# Patient Record
Sex: Female | Born: 1975 | Race: White | Hispanic: No | Marital: Married | Smoking: Current every day smoker
Health system: Southern US, Community
[De-identification: ages and names within clinical notes are randomized; demographics above are authoritative.]

## PROBLEM LIST (undated history)

## (undated) DIAGNOSIS — E079 Disorder of thyroid, unspecified: Secondary | ICD-10-CM

## (undated) DIAGNOSIS — J42 Unspecified chronic bronchitis: Secondary | ICD-10-CM

## (undated) HISTORY — PX: THYROIDECTOMY: SHX17

## (undated) HISTORY — PX: ABDOMINAL HYSTERECTOMY: SHX81

---

## 2012-12-12 ENCOUNTER — Emergency Department: Payer: Self-pay | Admitting: Emergency Medicine

## 2016-08-17 ENCOUNTER — Ambulatory Visit (INDEPENDENT_AMBULATORY_CARE_PROVIDER_SITE_OTHER): Payer: 59

## 2016-08-17 ENCOUNTER — Ambulatory Visit
Admission: EM | Admit: 2016-08-17 | Discharge: 2016-08-17 | Disposition: A | Discharge status: Home / Self Care | Payer: 59 | Attending: Family Medicine | Admitting: Family Medicine

## 2016-08-17 DIAGNOSIS — R079 Chest pain, unspecified: Secondary | ICD-10-CM

## 2016-08-17 DIAGNOSIS — J209 Acute bronchitis, unspecified: Secondary | ICD-10-CM

## 2016-08-17 DIAGNOSIS — R05 Cough: Secondary | ICD-10-CM | POA: Diagnosis present

## 2016-08-17 DIAGNOSIS — R109 Unspecified abdominal pain: Secondary | ICD-10-CM | POA: Diagnosis present

## 2016-08-17 MED ORDER — AZITHROMYCIN 500 MG PO TABS: ORAL_TABLET | ORAL | 0 refills | Status: DC

## 2016-08-17 MED ORDER — FEXOFENADINE-PSEUDOEPHED ER 180-240 MG PO TB24: 1.0000 | ORAL_TABLET | Freq: Every day | ORAL | 0 refills | Status: DC

## 2016-08-17 MED ORDER — HYDROCOD POLST-CPM POLST ER 10-8 MG/5ML PO SUER: 5.0000 mL | Freq: Two times a day (BID) | ORAL | 0 refills | Status: DC | PRN

## 2016-08-17 MED ORDER — ALBUTEROL SULFATE HFA 108 (90 BASE) MCG/ACT IN AERS: 2.0000 | INHALATION_SPRAY | RESPIRATORY_TRACT | 0 refills | Status: DC | PRN

## 2016-08-17 NOTE — ED Triage Notes (Signed)
Patient reports that she has been having a cough for the last 3 weeks today. Patient now reports right upper quadrant pain that started around 3pm today that radiates around flank to back and described as stabbing. Patient reports that she has been having an increase in heartburn recently.

## 2016-08-17 NOTE — ED Provider Notes (Signed)
[facility]    CSN: [id number] Arrival date & time: 08/17/16  1653     History   Chief Complaint Chief Complaint  Patient presents with  . Cough  . Abdominal Pain    RUQ    HPI Courtney [name] is a 40 y.o. female.   Patient's here because of cough for over 3 weeks. While the cough for 3 weeks she's had nasal congestion as well. She's also had some bronchospasms. She reports today she started having pain in the right lower chest upper abdomen she felt stabbing pain that went all the way to the back. Cough is nonproductive she does smoke she has no known drug allergies. Surgery she's had abdominal hysterectomy. She has multiple cancers in the family and diabetes and heart disease present in the family. She reports being under a lot of stress lately as well.   The history is provided by the patient.  Cough  Cough characteristics:  Productive, croupy, nocturnal and non-productive Sputum characteristics:  White and frothy Severity:  Moderate Onset quality:  Sudden Timing:  Constant Chronicity:  New Smoker: no   Relieved by:  Nothing Worsened by:  Activity and deep breathing Associated symptoms: chest pain, headaches and shortness of breath   Abdominal Pain  Associated symptoms: chest pain, cough and shortness of breath     History reviewed. No pertinent past medical history.  There are no active problems to display for this patient.   Past Surgical History:  Procedure Laterality Date  . ABDOMINAL HYSTERECTOMY      OB History    No data available       Home Medications    Prior to Admission medications   Medication Sig Start Date End Date Taking? Authorizing Provider  albuterol (PROVENTIL HFA;VENTOLIN HFA) 108 (90 Base) MCG/ACT inhaler Inhale 2 puffs into the lungs every 4 (four) hours as needed for wheezing or shortness of breath. 08/17/16   Hassan RowanEugene Winda Summerall, MD  azithromycin (ZITHROMAX) 500 MG tablet 1 tablet daily for 5 days 08/17/16   Hassan RowanEugene Aleesia Henney, MD   chlorpheniramine-HYDROcodone [facility](TUSSIONEX PENNKINETIC ER) 10-8 MG/5ML SUER Take 5 mLs by mouth every 12 (twelve) hours as needed for cough. 08/17/16   Hassan RowanEugene Tamotsu Wiederholt, MD  fexofenadine-pseudoephedrine (ALLEGRA-D ALLERGY & CONGESTION) 180-240 MG 24 hr tablet Take 1 tablet by mouth daily. 08/17/16   Hassan RowanEugene Jaun Galluzzo, MD    Family History History reviewed. No pertinent family history.  Social History Social History  Substance Use Topics  . Smoking status: Current Every Day Smoker    Packs/day: 1.00  . Smokeless tobacco: Never Used  . Alcohol use Yes     Comment: occasionally     Allergies   Patient has no known allergies.   Review of Systems Review of Systems  Respiratory: Positive for cough and shortness of breath.   Cardiovascular: Positive for chest pain.  Gastrointestinal: Positive for abdominal pain.  Neurological: Positive for headaches.  All other systems reviewed and are negative.    Physical Exam Triage Vital Signs ED Triage Vitals  Enc Vitals Group     BP 08/17/16 1843 112/63     Pulse Rate 08/17/16 1843 87     Resp 08/17/16 1843 17     Temp 08/17/16 1843 99.3 F (37.4 C)     Temp Source 08/17/16 1843 Oral     SpO2 08/17/16 1843 99 %     Weight 08/17/16 1841 155 lb (70.3 kg)     Height 08/17/16 1841 5\' 9"  (1.753  m)     Head Circumference --      Peak Flow --      Pain Score 08/17/16 1844 7     Pain Loc --      Pain Edu? --      Excl. in GC? --    No data found.   Updated Vital Signs BP 112/63 (BP Location: Left Arm)   Pulse 87   Temp 99.3 F (37.4 C) (Oral)   Resp 17   Ht 5\' 9"  (1.753 m)   Wt 155 lb (70.3 kg)   SpO2 99%   BMI 22.89 kg/m   Visual Acuity Right Eye Distance:   Left Eye Distance:   Bilateral Distance:    Right Eye Near:   Left Eye Near:    Bilateral Near:     Physical Exam  Constitutional: She is oriented to person, place, and time. She appears well-developed and well-nourished.  HENT:  Head: Normocephalic and atraumatic.    Right Ear: External ear normal.  Left Ear: External ear normal.  Nose: Nose normal.  Mouth/Throat: Oropharynx is clear and moist.  Eyes: Pupils are equal, round, and reactive to light.  Neck: Normal range of motion. Neck supple.  Cardiovascular: Normal rate, regular rhythm and normal heart sounds.   Pulmonary/Chest: Effort normal and breath sounds normal.  Abdominal: Soft. Bowel sounds are normal. She exhibits no distension.  Musculoskeletal: Normal range of motion.  Neurological: She is alert and oriented to person, place, and time.  Skin: Skin is warm. No rash noted. No erythema.  Psychiatric: She has a normal mood and affect.  Vitals reviewed.    UC Treatments / Results  Labs (all labs ordered are listed, but only abnormal results are displayed) Labs Reviewed - No data to display  EKG  EKG Interpretation None      ED ECG REPORT I, [place] Lebeau H, the attending physician, personally viewed and interpreted this ECG.   Date: 08/17/2016  EKG Time:19:23:48  Rate: 82  Rhythm: normal EKG, normal sinus rhythm, there are no previous tracings available for comparison  Axis: 21  Intervals:none  ST&T Change: none   Radiology Dg Chest 2 View  Result Date: 08/17/2016 CLINICAL DATA:  Cough for 3 weeks EXAM: CHEST  2 VIEW COMPARISON:  None. FINDINGS: The heart size and mediastinal contours are within normal limits. Both lungs are clear. The visualized skeletal structures are unremarkable. IMPRESSION: No active cardiopulmonary disease. Electronically Signed   By: Natasha MeadLiviu  Pop M.D.   On: 08/17/2016 20:03    Procedures Procedures (including critical care time)  Medications Ordered in UC Medications - No data to display   Initial Impression / Assessment and Plan / UC Course  I have reviewed the triage vital signs and the nursing notes.  Pertinent labs & imaging results that were available during my care of the patient were reviewed by me and considered in my medical decision  making (see chart for details).  Clinical Course    The chest x-ray being negative EKG being negative will treat for bronchitis. Placed on albuterol inhaler, Tussionex for cough 1 teaspoon twice a day Zithromax 500 mg 1 tablet daily for 5 days and Allegra-D. We'll have a follow-up PCP of her choice to give a work note for today and tomorrow as well.     Final Clinical Impressions(s) / UC Diagnoses   Final diagnoses:  Acute bronchitis with bronchospasm  Right-sided chest pain    New Prescriptions Discharge Medication List as of  08/17/2016  7:59 PM    START taking these medications   Details  albuterol (PROVENTIL HFA;VENTOLIN HFA) 108 (90 Base) MCG/ACT inhaler Inhale 2 puffs into the lungs every 4 (four) hours as needed for wheezing or shortness of breath., Starting Tue 08/17/2016, Normal    azithromycin (ZITHROMAX) 500 MG tablet 1 tablet daily for 5 days, Normal    chlorpheniramine-HYDROcodone (TUSSIONEX PENNKINETIC ER) 10-8 MG/5ML SUER Take 5 mLs by mouth every 12 (twelve) hours as needed for cough., Starting Tue 08/17/2016, Normal    fexofenadine-pseudoephedrine (ALLEGRA-D ALLERGY & CONGESTION) 180-240 MG 24 hr tablet Take 1 tablet by mouth daily., Starting Tue 08/17/2016, Normal         Hassan Rowan, MD 08/17/16 2049

## 2017-05-16 ENCOUNTER — Ambulatory Visit
Admission: EM | Admit: 2017-05-16 | Discharge: 2017-05-16 | Disposition: A | Discharge status: Home / Self Care | Payer: BLUE CROSS/BLUE SHIELD | Attending: Family Medicine | Admitting: Family Medicine

## 2017-05-16 ENCOUNTER — Encounter: Payer: Self-pay | Admitting: Emergency Medicine

## 2017-05-16 DIAGNOSIS — M549 Dorsalgia, unspecified: Secondary | ICD-10-CM | POA: Diagnosis not present

## 2017-05-16 DIAGNOSIS — R110 Nausea: Secondary | ICD-10-CM

## 2017-05-16 DIAGNOSIS — R519 Headache, unspecified: Secondary | ICD-10-CM

## 2017-05-16 DIAGNOSIS — R51 Headache: Secondary | ICD-10-CM

## 2017-05-16 MED ORDER — KETOROLAC TROMETHAMINE 60 MG/2ML IM SOLN
60.0000 mg | Freq: Once | INTRAMUSCULAR | Status: AC
  Administered 2017-05-16: 60 mg via INTRAMUSCULAR

## 2017-05-16 MED ORDER — HYDROCODONE-ACETAMINOPHEN 5-325 MG PO TABS: ORAL_TABLET | ORAL | 0 refills | Status: DC

## 2017-05-16 NOTE — ED Triage Notes (Signed)
Patient states she has had a headache off and on for about 2 weeks.  Per patient the back of her head feels numb and it runs down her back and into her legs

## 2017-05-16 NOTE — Discharge Instructions (Signed)
Establish care with Primary Care Provider for follow up °

## 2017-05-16 NOTE — ED Provider Notes (Signed)
[facility]    CSN: [id number] Arrival date & time: 05/16/17  1521     History   Chief Complaint Chief Complaint  Patient presents with  . Headache    HPI Courtney [name] is a 40 y.o. female.   The history is provided by the patient.  Headache  Pain location:  R parietal Radiates to:  R neck Severity currently:  7/10 Duration:  2 weeks Timing:  Constant Chronicity:  New Similar to prior headaches: no   Context comment:  Unknown trigger Relieved by:  Nothing Ineffective treatments:  Acetaminophen and NSAIDs Associated symptoms: back pain (chronic), nausea and photophobia   Associated symptoms: no abdominal pain, no blurred vision, no congestion, no cough, no diarrhea, no dizziness, no drainage, no ear pain, no eye pain, no facial pain, no fatigue, no fever, no focal weakness, no hearing loss, no loss of balance, no myalgias, no near-syncope, no neck pain, no neck stiffness, no numbness, no paresthesias, no seizures, no sinus pressure, no sore throat, no swollen glands, no syncope, no tingling, no URI, no visual change, no vomiting and no weakness     History reviewed. No pertinent past medical history.  There are no active problems to display for this patient.   Past Surgical History:  Procedure Laterality Date  . ABDOMINAL HYSTERECTOMY      OB History    No data available       Home Medications    Prior to Admission medications   Medication Sig Start Date End Date Taking? Authorizing Provider  albuterol (PROVENTIL HFA;VENTOLIN HFA) 108 (90 Base) MCG/ACT inhaler Inhale 2 puffs into the lungs every 4 (four) hours as needed for wheezing or shortness of breath. 08/17/16   Hassan Rowan, MD  azithromycin (ZITHROMAX) 500 MG tablet 1 tablet daily for 5 days 08/17/16   Hassan Rowan, MD  chlorpheniramine-HYDROcodone [facility] ER) 10-8 MG/5ML SUER Take 5 mLs by mouth every 12 (twelve) hours as needed for cough. 08/17/16   Hassan Rowan, MD    fexofenadine-pseudoephedrine (ALLEGRA-D ALLERGY & CONGESTION) 180-240 MG 24 hr tablet Take 1 tablet by mouth daily. 08/17/16   Hassan Rowan, MD  HYDROcodone-acetaminophen (NORCO/VICODIN) 5-325 MG tablet 1-2 tabs po qd prn 05/16/17   Payton Mccallum, MD    Family History Family History  Problem Relation Age of Onset  . Diabetes Mother   . Cancer Other   . Pulmonary embolism Sister   . Deep vein thrombosis Sister     Social History Social History  Substance Use Topics  . Smoking status: Current Every Day Smoker    Packs/day: 1.00  . Smokeless tobacco: Never Used  . Alcohol use Yes     Comment: occasionally     Allergies   Patient has no known allergies.   Review of Systems Review of Systems  Constitutional: Negative for fatigue and fever.  HENT: Negative for congestion, ear pain, hearing loss, postnasal drip, sinus pressure and sore throat.   Eyes: Positive for photophobia. Negative for blurred vision and pain.  Respiratory: Negative for cough.   Cardiovascular: Negative for syncope and near-syncope.  Gastrointestinal: Positive for nausea. Negative for abdominal pain, diarrhea and vomiting.  Musculoskeletal: Positive for back pain (chronic). Negative for myalgias, neck pain and neck stiffness.  Neurological: Positive for headaches. Negative for dizziness, focal weakness, seizures, weakness, numbness, paresthesias and loss of balance.     Physical Exam Triage Vital Signs ED Triage Vitals  Enc Vitals Group     BP 05/16/17  1538 120/71     Pulse Rate 05/16/17 1538 91     Resp 05/16/17 1538 18     Temp 05/16/17 1538 98.8 F (37.1 C)     Temp src --      SpO2 05/16/17 1538 98 %     Weight 05/16/17 1535 172 lb 2.9 oz (78.1 kg)     Height 05/16/17 1535 5\' 9"  (1.753 m)     Head Circumference --      Peak Flow --      Pain Score 05/16/17 1535 9     Pain Loc --      Pain Edu? --      Excl. in GC? --    No data found.   Updated Vital Signs BP 120/71 (BP Location:  Left Arm)   Pulse 91   Temp 98.8 F (37.1 C)   Resp 18   Ht 5\' 9"  (1.753 m)   Wt 172 lb 2.9 oz (78.1 kg)   SpO2 98%   BMI 25.43 kg/m   Visual Acuity Right Eye Distance:   Left Eye Distance:   Bilateral Distance:    Right Eye Near:   Left Eye Near:    Bilateral Near:     Physical Exam  Constitutional: She is oriented to person, place, and time. She appears well-developed and well-nourished. No distress.  HENT:  Head: Normocephalic.  Right Ear: Tympanic membrane, external ear and ear canal normal.  Left Ear: Tympanic membrane, external ear and ear canal normal.  Nose: Nose normal.  Mouth/Throat: Oropharynx is clear and moist and mucous membranes are normal.  Eyes: Pupils are equal, round, and reactive to light. Conjunctivae and EOM are normal. Right eye exhibits no discharge. Left eye exhibits no discharge. No scleral icterus.  Neck: Normal range of motion. Neck supple. No JVD present. No tracheal deviation present. No thyromegaly present.  Cardiovascular: Normal rate.   Pulmonary/Chest: Effort normal. No stridor. No respiratory distress.  Musculoskeletal: She exhibits no edema or tenderness.  Lymphadenopathy:    She has no cervical adenopathy.  Neurological: She is alert and oriented to person, place, and time. She has normal reflexes. She displays normal reflexes. No cranial nerve deficit or sensory deficit. She exhibits normal muscle tone. Coordination normal.  Skin: No rash noted. She is not diaphoretic.  Nursing note and vitals reviewed.    UC Treatments / Results  Labs (all labs ordered are listed, but only abnormal results are displayed) Labs Reviewed - No data to display  EKG  EKG Interpretation None       Radiology No results found.  Procedures Procedures (including critical care time)  Medications Ordered in UC Medications  ketorolac (TORADOL) injection 60 mg (60 mg Intramuscular Given 05/16/17 1623)     Initial Impression / Assessment and Plan  / UC Course  I have reviewed the triage vital signs and the nursing notes.  Pertinent labs & imaging results that were available during my care of the patient were reviewed by me and considered in my medical decision making (see chart for details).       Final Clinical Impressions(s) / UC Diagnoses   Final diagnoses:  Nonintractable episodic headache, unspecified headache type    New Prescriptions Discharge Medication List as of 05/16/2017  4:54 PM    START taking these medications   Details  HYDROcodone-acetaminophen (NORCO/VICODIN) 5-325 MG tablet 1-2 tabs po qd prn, Print       1. diagnosis reviewed with patient 2. rx as  per orders above; reviewed possible side effects, interactions, risks and benefits  3. Recommend supportive treatment with rest, otc analgesics prn 4. Patient given toradol 60mg  IM x1 with improvement of symptoms 5. Follow-up prn if symptoms worsen or don't improve  Controlled Substance Prescriptions [place] Controlled Substance Registry consulted? Yes, I have consulted the [place] Controlled Substances Registry for this patient, and feel the risk/benefit ratio today is favorable for proceeding with this prescription for a controlled substance.   Payton Mccallum, MD 05/16/17 [phone]

## 2017-05-16 NOTE — ED Notes (Signed)
Patient shows no signs of adverse reaction to medication at this time.  

## 2018-06-02 ENCOUNTER — Other Ambulatory Visit: Payer: Self-pay

## 2018-06-02 ENCOUNTER — Ambulatory Visit
Admission: EM | Admit: 2018-06-02 | Discharge: 2018-06-02 | Disposition: A | Discharge status: Home / Self Care | Payer: BLUE CROSS/BLUE SHIELD | Attending: Emergency Medicine | Admitting: Emergency Medicine

## 2018-06-02 DIAGNOSIS — R300 Dysuria: Secondary | ICD-10-CM | POA: Diagnosis not present

## 2018-06-02 DIAGNOSIS — Z90710 Acquired absence of both cervix and uterus: Secondary | ICD-10-CM | POA: Diagnosis not present

## 2018-06-02 DIAGNOSIS — Z113 Encounter for screening for infections with a predominantly sexual mode of transmission: Secondary | ICD-10-CM

## 2018-06-02 DIAGNOSIS — I863 Vulval varices: Secondary | ICD-10-CM | POA: Diagnosis not present

## 2018-06-02 DIAGNOSIS — N938 Other specified abnormal uterine and vaginal bleeding: Secondary | ICD-10-CM

## 2018-06-02 DIAGNOSIS — N939 Abnormal uterine and vaginal bleeding, unspecified: Secondary | ICD-10-CM

## 2018-06-02 LAB — URINALYSIS, COMPLETE (UACMP) WITH MICROSCOPIC
Bacteria, UA: NONE SEEN
Bilirubin Urine: NEGATIVE
Glucose, UA: 250 mg/dL — AB
Ketones, ur: NEGATIVE mg/dL
Leukocytes, UA: NEGATIVE
Nitrite: NEGATIVE
Protein, ur: NEGATIVE mg/dL
Specific Gravity, Urine: 1.02 (ref 1.005–1.030)
pH: 6.5 (ref 5.0–8.0)

## 2018-06-02 LAB — WET PREP, GENITAL
Clue Cells Wet Prep HPF POC: NONE SEEN
Sperm: NONE SEEN
Trich, Wet Prep: NONE SEEN
Yeast Wet Prep HPF POC: NONE SEEN

## 2018-06-02 LAB — CHLAMYDIA/NGC RT PCR (ARMC ONLY)
Chlamydia Tr: NOT DETECTED
N gonorrhoeae: NOT DETECTED

## 2018-06-02 NOTE — Discharge Instructions (Signed)
Your exam today revealed a vulvar varicosity. This is likely due to your use of kegel exercisers. This increased strain can cause veins to swell and form a varicosity which can bleed. Apply OTC Preparation H as directed. Take warm sitz baths and schedule and appointment with gynecology as soon as possible. Go to  ER for any uncontrolled bleeding. Routine culture sent for bacterial vaginal infections. We will call with results.

## 2018-06-02 NOTE — ED Triage Notes (Signed)
Patient complains of vaginal bleeding that started this morning. Patient states that she had a hysterectomy 6-7 years ago. Patient reports that she still has her ovaries. Patient states that she has recently been working out more and was unsure if that was causing it.

## 2018-06-02 NOTE — ED Provider Notes (Signed)
[facility]    CSN: [id number] Arrival date & time: 06/02/18  1316     History   Chief Complaint Chief Complaint  Patient presents with  . Vaginal Bleeding    HPI Courtney [name] is a 42 y.o. female. Patient states she began having "a lot" of vaginal bleeding this morning. Patient states she has had a hysterectomy 6-7 years ago. Admitted to heavy bleeding and "lesions on cervix" before hysterectomy. Denies history of gyne cancers. Patient denies vaginal discharge or pelvic pain. Admits to painful urination intermittently since yesterday. She does admit to inserting kegel balls and doing kegel exercises daily. She has no other complaints today.  HPI  History reviewed. No pertinent past medical history.  There are no active problems to display for this patient.   Past Surgical History:  Procedure Laterality Date  . ABDOMINAL HYSTERECTOMY      OB History   None      Home Medications    Prior to Admission medications   Medication Sig Start Date End Date Taking? Authorizing Provider  levothyroxine (SYNTHROID, LEVOTHROID) 112 MCG tablet  04/23/18  Yes [provider]  albuterol (PROVENTIL HFA;VENTOLIN HFA) 108 (90 Base) MCG/ACT inhaler Inhale 2 puffs into the lungs every 4 (four) hours as needed for wheezing or shortness of breath. 08/17/16   Hassan Rowan, MD  azithromycin (ZITHROMAX) 500 MG tablet 1 tablet daily for 5 days 08/17/16   Hassan Rowan, MD  chlorpheniramine-HYDROcodone [facility] ER) 10-8 MG/5ML SUER Take 5 mLs by mouth every 12 (twelve) hours as needed for cough. 08/17/16   Hassan Rowan, MD  fexofenadine-pseudoephedrine (ALLEGRA-D ALLERGY & CONGESTION) 180-240 MG 24 hr tablet Take 1 tablet by mouth daily. 08/17/16   Hassan Rowan, MD  HYDROcodone-acetaminophen (NORCO/VICODIN) 5-325 MG tablet 1-2 tabs po qd prn 05/16/17   Payton Mccallum, MD    Family History Family History  Problem Relation Age of Onset  . Diabetes Mother   .  Cancer Other   . Pulmonary embolism Sister   . Deep vein thrombosis Sister     Social History Social History   Tobacco Use  . Smoking status: Current Every Day Smoker    Packs/day: 2.00  . Smokeless tobacco: Never Used  Substance Use Topics  . Alcohol use: Yes    Comment: occasionally  . Drug use: No     Allergies   Patient has no known allergies.   Review of Systems Review of Systems  Constitutional: Negative for appetite change and fever.  Gastrointestinal: Negative for abdominal distention, abdominal pain, diarrhea, nausea and vomiting.  Genitourinary: Positive for dysuria and vaginal bleeding. Negative for dyspareunia, frequency, genital sores, hematuria, vaginal discharge and vaginal pain.  Skin: Negative for rash.  Neurological: Negative for dizziness.     Physical Exam Triage Vital Signs ED Triage Vitals  Enc Vitals Group     BP 06/02/18 1343 130/64     Pulse Rate 06/02/18 1343 (!) 104     Resp 06/02/18 1343 18     Temp 06/02/18 1343 99.1 F (37.3 C)     Temp Source 06/02/18 1343 Oral     SpO2 06/02/18 1343 99 %     Weight 06/02/18 1341 165 lb (74.8 kg)     Height 06/02/18 1341 5\' 8"  (1.727 m)     Head Circumference --      Peak Flow --      Pain Score 06/02/18 1340 6     Pain Loc --  Pain Edu? --      Excl. in GC? --    No data found.  Updated Vital Signs BP 130/64 (BP Location: Left Arm)   Pulse (!) 104   Temp 99.1 F (37.3 C) (Oral)   Resp 18   Ht 5\' 8"  (1.727 m)   Wt 165 lb (74.8 kg)   SpO2 99%   BMI 25.09 kg/m       Physical Exam  Constitutional: She is oriented to person, place, and time. She appears well-developed and well-nourished. No distress.  HENT:  Head: Normocephalic and atraumatic.  Eyes: Conjunctivae and EOM are normal. No scleral icterus.  Cardiovascular: Normal rate, regular rhythm and normal heart sounds.  No murmur heard. Pulmonary/Chest: Effort normal and breath sounds normal. She has no wheezes.  Abdominal:  Soft. There is no tenderness. There is no guarding.  Genitourinary: Pelvic exam was performed with patient supine.  Genitourinary Comments: Labia normal without lesions. Enlarged varicosity of anterior vaginal wall noted which bleeds mildly upon palpation. No tenderness on palpation of varicosity. Small amount of bright red blood in vaginal vault. No cervix or uterus (pt with hysterectomy), no vaginal discharge. No obvious pain upon examination.   Lymphadenopathy: No inguinal adenopathy noted on the right or left side.  Neurological: She is alert and oriented to person, place, and time.  Skin: Skin is warm and dry. She is not diaphoretic. No erythema.  Psychiatric: She has a normal mood and affect. Her behavior is normal.  Nursing note and vitals reviewed.    UC Treatments / Results  Labs (all labs ordered are listed, but only abnormal results are displayed) Labs Reviewed  WET PREP, GENITAL - Abnormal; Notable for the following components:      Result Value   WBC, Wet Prep HPF POC RARE (*)    All other components within normal limits  URINALYSIS, COMPLETE (UACMP) WITH MICROSCOPIC - Abnormal; Notable for the following components:   Glucose, UA 250 (*)    Hgb urine dipstick SMALL (*)    All other components within normal limits  CHLAMYDIA/NGC RT PCR [facility] ONLY)    EKG None  Radiology No results found.  Procedures Procedures (including critical care time)  Medications Ordered in UC Medications - No data to display  Initial Impression / Assessment and Plan / UC Course  I have reviewed the triage vital signs and the nursing notes.  Pertinent labs & imaging results that were available during my care of the patient were reviewed by me and considered in my medical decision making (see chart for details).   Exam reveals vulvar varicosity. Likely due to kegel exercises. Stop exercises. Wear pads. Avoid intercourse. Advised preparation H, sitz baths, and f/u with  gynecology  GC/Chlam and wet prep done. Will call with results   Final Clinical Impressions(s) / UC Diagnoses   Final diagnoses:  Vulvar varicose veins  Vaginal bleeding     Discharge Instructions     Your exam today revealed a vulvar varicosity. This is likely due to your use of kegel exercisers. This increased strain can cause veins to swell and form a varicosity which can bleed. Apply OTC Preparation H as directed. Take warm sitz baths and schedule and appointment with gynecology as soon as possible. Go to  ER for any uncontrolled bleeding. Routine culture sent for bacterial vaginal infections. We will call with results.    ED Prescriptions    None     Controlled Substance Prescriptions [place] Controlled Substance Registry  consulted? Not Applicable   Gareth Morgan 06/03/18 [id number]

## 2018-12-18 ENCOUNTER — Other Ambulatory Visit: Payer: Self-pay | Admitting: Family Medicine

## 2018-12-18 ENCOUNTER — Encounter: Payer: Self-pay | Admitting: Emergency Medicine

## 2018-12-18 ENCOUNTER — Other Ambulatory Visit: Payer: Self-pay

## 2018-12-18 ENCOUNTER — Ambulatory Visit
Admission: EM | Admit: 2018-12-18 | Discharge: 2018-12-18 | Disposition: A | Discharge status: Home / Self Care | Payer: BLUE CROSS/BLUE SHIELD | Attending: Family Medicine | Admitting: Family Medicine

## 2018-12-18 ENCOUNTER — Ambulatory Visit
Admission: RE | Admit: 2018-12-18 | Discharge: 2018-12-18 | Disposition: A | Discharge status: Home / Self Care | Payer: BLUE CROSS/BLUE SHIELD | Source: Ambulatory Visit | Attending: Family Medicine | Admitting: Family Medicine

## 2018-12-18 ENCOUNTER — Ambulatory Visit
Admission: RE | Admit: 2018-12-18 | Discharge: 2018-12-18 | Disposition: A | Discharge status: Home / Self Care | Payer: BLUE CROSS/BLUE SHIELD | Attending: Family Medicine | Admitting: Family Medicine

## 2018-12-18 DIAGNOSIS — R079 Chest pain, unspecified: Secondary | ICD-10-CM

## 2018-12-18 DIAGNOSIS — F17210 Nicotine dependence, cigarettes, uncomplicated: Secondary | ICD-10-CM | POA: Diagnosis not present

## 2018-12-18 DIAGNOSIS — R05 Cough: Secondary | ICD-10-CM

## 2018-12-18 DIAGNOSIS — R059 Cough, unspecified: Secondary | ICD-10-CM

## 2018-12-18 DIAGNOSIS — J069 Acute upper respiratory infection, unspecified: Secondary | ICD-10-CM

## 2018-12-18 DIAGNOSIS — R0981 Nasal congestion: Secondary | ICD-10-CM | POA: Diagnosis not present

## 2018-12-18 DIAGNOSIS — J40 Bronchitis, not specified as acute or chronic: Secondary | ICD-10-CM

## 2018-12-18 DIAGNOSIS — J0100 Acute maxillary sinusitis, unspecified: Secondary | ICD-10-CM

## 2018-12-18 HISTORY — DX: Disorder of thyroid, unspecified: E07.9

## 2018-12-18 MED ORDER — AZITHROMYCIN 250 MG PO TABS: ORAL_TABLET | ORAL | 0 refills | Status: DC

## 2018-12-18 NOTE — Discharge Instructions (Signed)
-  Follow instructions and use prescriptions as provided by Dr. Zada Finders. -Instructions for sinus rinse as attached -If no improvement in 2 to 3 days or if your symptoms worsen, can start the antibiotic.  Azithromycin, take as prescribed on the package. -Plenty of fluids -Follow with primary care as needed for this clinic

## 2018-12-18 NOTE — ED Triage Notes (Signed)
Patient c/o productive cough and nasal congestion that started last Thursday.  Denies fever. Patient has taken OTC sinus vicks at night for her symptoms.

## 2018-12-18 NOTE — ED Provider Notes (Signed)
[facility]    CSN: [id number] Arrival date & time: 12/18/18  1644     History   Chief Complaint Chief Complaint  Patient presents with   Cough   Nasal Congestion    HPI Courtney [name] is a 43 y.o. female.   Patient is a 43 year old female with past medical history of thyroid disease who presents with a dry cough that started Thursday.  She states with a week and became more of a "chesty" cough with some chest wall tenderness.  She states the mucus she coughs up has a bad taste but does not know the color she is just swallowing it.  She also reports sinus tenderness with mucus that is clear with a yellowish tent that is also bad tasting.  She also reports a sore throat several days ago.  She denies any fevers.  She has been taking Vicks and Synex over-the-counter as well as BC powder.  Patient reports she symptoms are the ones that she has typically before she gets bronchitis or a sinus infection.  Patient was seen by primary care today and given prescriptions for Tessalon and albuterol for her cough, Skelaxin for chest wall tenderness, and Atrovent spray for her sinus congestion and pain.  She states that the doctor told her since she did have a fever she did not have bronchitis or sinusitis and she states she had been told something like that ever before so she wanted to come in for second opinion.     Past Medical History:  Diagnosis Date   Thyroid disease     There are no active problems to display for this patient.   Past Surgical History:  Procedure Laterality Date   ABDOMINAL HYSTERECTOMY      OB History   No obstetric history on file.      Home Medications    Prior to Admission medications   Medication Sig Start Date End Date Taking? Authorizing Provider  albuterol (PROVENTIL HFA;VENTOLIN HFA) 108 (90 Base) MCG/ACT inhaler Inhale 2 puffs into the lungs every 4 (four) hours as needed for wheezing or shortness of breath. 08/17/16  Yes Hassan Rowan, MD  HYDROcodone-acetaminophen (NORCO/VICODIN) 5-325 MG tablet 1-2 tabs po qd prn 05/16/17  Yes Payton Mccallum, MD  levothyroxine (SYNTHROID, LEVOTHROID) 112 MCG tablet  04/23/18  Yes [provider]  azithromycin (ZITHROMAX Z-PAK) 250 MG tablet Take 2 tablets by mouth the first day followed by one tablet daily for next 4 days. 12/18/18   Candis Schatz, PA-C    Family History Family History  Problem Relation Age of Onset   Diabetes Mother    Cancer Other    Pulmonary embolism Sister    Deep vein thrombosis Sister     Social History Social History   Tobacco Use   Smoking status: Current Every Day Smoker    Packs/day: 2.00   Smokeless tobacco: Never Used  Substance Use Topics   Alcohol use: Yes    Comment: occasionally   Drug use: No     Allergies   Patient has no known allergies.   Review of Systems Review of Systems  As noted above in HPI.  Other systems reviewed and found to be negative.   Physical Exam Triage Vital Signs ED Triage Vitals  Enc Vitals Group     BP 12/18/18 1655 102/65     Pulse Rate 12/18/18 1655 95     Resp 12/18/18 1655 18     Temp 12/18/18 1655 98.5  F (36.9 C)     Temp Source 12/18/18 1655 Oral     SpO2 12/18/18 1655 100 %     Weight 12/18/18 1653 151 lb (68.5 kg)     Height 12/18/18 1653  (1.727 m)     Head Circumference --      Peak Flow --      Pain Score 12/18/18 1653 0     Pain Loc --      Pain Edu? --      Excl. in GC? --    No data found.  Updated Vital Signs BP 102/65 (BP Location: Right Arm)    Pulse 95    Temp 98.5 F (36.9 C) (Oral)    Resp 18    Ht  (1.727 m)    Wt 151 lb (68.5 kg)    SpO2 100%    BMI 22.96 kg/m    Physical Exam Constitutional:      Appearance: Normal appearance. She is ill-appearing.  HENT:     Head: Normocephalic and atraumatic.     Right Ear: Tympanic membrane and ear canal normal.     Left Ear: Tympanic membrane and ear canal normal.     Nose:     Right  Sinus: Maxillary sinus tenderness present. No frontal sinus tenderness.     Left Sinus: Maxillary sinus tenderness present. No frontal sinus tenderness.     Mouth/Throat:     Mouth: Mucous membranes are moist.  Eyes:     Extraocular Movements: Extraocular movements intact.     Pupils: Pupils are equal, round, and reactive to light.  Neck:     Musculoskeletal: Normal range of motion and neck supple.  Cardiovascular:     Rate and Rhythm: Normal rate and regular rhythm.     Heart sounds: No murmur. No friction rub. No gallop.   Pulmonary:     Effort: Pulmonary effort is normal. No respiratory distress.     Breath sounds: No wheezing, rhonchi or rales.     Comments: Cough with forced expiration Abdominal:     General: Abdomen is flat.     Palpations: Abdomen is soft.  Lymphadenopathy:     Cervical: Cervical adenopathy (no adenopathy palpated but reports tenderness) present.  Skin:    General: Skin is warm and dry.  Neurological:     General: No focal deficit present.     Mental Status: She is alert and oriented to person, place, and time.  Psychiatric:        Mood and Affect: Mood normal.        Behavior: Behavior normal.      UC Treatments / Results  Labs (all labs ordered are listed, but only abnormal results are displayed) Labs Reviewed - No data to display  EKG None  Radiology Dg Chest 2 View  Result Date: 12/18/2018 CLINICAL DATA:  Sick x4 days with dry to wet cough and sore throat with sinus drainage. EXAM: CHEST - 2 VIEW COMPARISON:  08/17/2016 FINDINGS: The heart size and mediastinal contours are within normal limits. Both lungs are clear. The visualized skeletal structures are unremarkable. IMPRESSION: No active cardiopulmonary disease. Electronically Signed   By: Tollie Eth M.D.   On: 12/18/2018 16:38    Procedures Procedures (including critical care time)  Medications Ordered in UC Medications - No data to display  Initial Impression / Assessment and Plan  / UC Course  I have reviewed the triage vital signs and the nursing notes.  Pertinent  labs & imaging results that were available during my care of the patient were reviewed by me and considered in my medical decision making (see chart for details).     Presents with upper respiratory symptoms with cough and sinus tenderness.  Patient likely with a viral sinusitis and bronchitis.  She was seen by primary care today but wanted a second opinion based on her conversation with that provider.  The prescriptions provided by that provider are you are accurate and in line with what I would give her as well.  We will also give her prescription for azithromycin that she can start in 2 to 3 days if she is not any better.  Patient states that she did not come here solely to get something that she was not giving at the other office but just had concerns with what she was told.  Final Clinical Impressions(s) / UC Diagnoses   Final diagnoses:  Bronchitis  Upper respiratory tract infection, unspecified type  Acute maxillary sinusitis, recurrence not specified     Discharge Instructions     -Follow instructions and use prescriptions as provided by Dr. Zada Finders. -Instructions for sinus rinse as attached -If no improvement in 2 to 3 days or if your symptoms worsen, can start the antibiotic.  Azithromycin, take as prescribed on the package. -Plenty of fluids -Follow with primary care as needed for this clinic    ED Prescriptions    Medication Sig Dispense Auth. Provider   azithromycin (ZITHROMAX Z-PAK) 250 MG tablet Take 2 tablets by mouth the first day followed by one tablet daily for next 4 days. 6 tablet Candis Schatz, PA-C     Controlled Substance Prescriptions [place] Controlled Substance Registry consulted? Not Applicable   Candis Schatz, PA-C 12/18/18 1725

## 2019-01-22 ENCOUNTER — Ambulatory Visit
Admission: EM | Admit: 2019-01-22 | Discharge: 2019-01-22 | Disposition: A | Discharge status: Home / Self Care | Payer: BLUE CROSS/BLUE SHIELD

## 2019-01-22 ENCOUNTER — Encounter: Payer: Self-pay | Admitting: Emergency Medicine

## 2019-01-22 ENCOUNTER — Other Ambulatory Visit: Payer: Self-pay

## 2019-01-22 DIAGNOSIS — R509 Fever, unspecified: Secondary | ICD-10-CM

## 2019-01-22 DIAGNOSIS — R05 Cough: Secondary | ICD-10-CM | POA: Diagnosis not present

## 2019-01-22 DIAGNOSIS — R059 Cough, unspecified: Secondary | ICD-10-CM

## 2019-01-22 DIAGNOSIS — Z7189 Other specified counseling: Secondary | ICD-10-CM

## 2019-01-22 NOTE — ED Provider Notes (Signed)
[facility] ____________________________________________  Time seen: Approximately 8:55 AM  I have reviewed the triage vital signs and the nursing notes.   HISTORY  Chief Complaint work note   HPI Courtney [name] is a [age] y.o. female presenting for evaluation of fever.  Patient reports this past Friday while she was at work they were checking underneath her eyes that were dry skin was, and they checked her temperature and it was 99.  States they sent her home due to low-grade fever.  States Friday night into Saturday she had an increase of temperature at home going up to 102.  States she has had accompanying cough and postnasal drainage, but states she has a smoker and has been getting over bronchitis.  Denies accompanying sore throat, chest pain or shortness of breath.  Has continued to remain active, continues eat and drink well.  States that this time feeling well and feeling much better.  Denies aggravating alleviating factors.  Denies other complaints at this time.  Denies known sick contacts, denies COVID-19 contacts.  Duanne LimerickJones, Deanna C, MD: PCP   Past Medical History:  Diagnosis Date  . Thyroid disease     There are no active problems to display for this patient.   Past Surgical History:  Procedure Laterality Date  . ABDOMINAL HYSTERECTOMY       No current facility-administered medications for this encounter.   Current Outpatient Medications:  .  levothyroxine (SYNTHROID, LEVOTHROID) 112 MCG tablet, , Disp: , Rfl: 0 .  albuterol (PROVENTIL HFA;VENTOLIN HFA) 108 (90 Base) MCG/ACT inhaler, Inhale 2 puffs into the lungs every 4 (four) hours as needed for wheezing or shortness of breath., Disp: 1 Inhaler, Rfl: 0 .  azithromycin (ZITHROMAX Z-PAK) 250 MG tablet, Take 2 tablets by mouth the first day followed by one tablet daily for next 4 days., Disp: 6 tablet, Rfl: 0 .  HYDROcodone-acetaminophen (NORCO/VICODIN) 5-325 MG tablet, 1-2 tabs po qd prn, Disp: 6 tablet,  Rfl: 0  Allergies Patient has no known allergies.  Family History  Problem Relation Age of Onset  . Diabetes Mother   . Cancer Other   . Pulmonary embolism Sister   . Deep vein thrombosis Sister     Social History Social History   Tobacco Use  . Smoking status: Current Every Day Smoker    Packs/day: 2.00  . Smokeless tobacco: Never Used  Substance Use Topics  . Alcohol use: Yes    Comment: occasionally  . Drug use: No    Review of Systems Constitutional: Positive fever ENT: No sore throat. Cardiovascular: Denies chest pain. Respiratory: Denies shortness of breath. Gastrointestinal: No abdominal pain.  Genitourinary: Negative for dysuria.  Musculoskeletal: Negative for back pain. Skin: Negative for rash.   ____________________________________________   PHYSICAL EXAM:  VITAL SIGNS: ED Triage Vitals  Enc Vitals Group     BP 01/22/19 0822 116/76     Pulse Rate 01/22/19 0822 88     Resp 01/22/19 0822 18     Temp 01/22/19 0822 98.2 F (36.8 C)     Temp Source 01/22/19 0822 Oral     SpO2 01/22/19 0822 99 %     Weight 01/22/19 0821 145 lb (65.8 kg)     Height 01/22/19 0821 5\' 9"  (1.753 m)     Head Circumference --      Peak Flow --      Pain Score 01/22/19 0821 0     Pain Loc --      Pain Edu? --  Excl. in GC? --     Constitutional: Alert and oriented. Well appearing and in no acute distress. Eyes: Conjunctivae are normal.  No drainage bilaterally. Head: Atraumatic. No sinus tenderness to palpation. No swelling. No erythema.  Minimal dry skin beneath bilateral eyes without erythema or induration.  Ears: no erythema, normal TMs bilaterally.   Nose:No nasal congestion   Mouth/Throat: Mucous membranes are moist. No pharyngeal erythema. No tonsillar swelling or exudate.  Neck: No stridor.  No cervical spine tenderness to palpation. Hematological/Lymphatic/Immunilogical: No cervical lymphadenopathy. Cardiovascular: Normal rate, regular rhythm. Grossly  normal heart sounds.  Good peripheral circulation. Respiratory: Normal respiratory effort.  No retractions. No wheezes, rales or rhonchi. Good air movement.  Musculoskeletal: Ambulatory with steady gait.  Neurologic:  Normal speech and language. No gait instability. Skin:  Skin appears warm, dry and intact. No rash noted. Psychiatric: Mood and affect are normal. Speech and behavior are normal. ___________________________________________   LABS (all labs ordered are listed, but only abnormal results are displayed)  Labs Reviewed - No data to display ____________________________________________   PROCEDURES Procedures    INITIAL IMPRESSION / ASSESSMENT AND PLAN / ED COURSE  Pertinent labs & imaging results that were available during my care of the patient were reviewed by me and considered in my medical decision making (see chart for details).  Well-appearing patient.  No acute distress.  Suspect viral illness.  Patient states that this time she is feeling well.  However recommend for patient to have supportive care, over-the-counter medication as needed.  [facility] COVID-19 information given and recommended for patient to adhere including not return to work until 1 week after symptom onset, improving symptoms and 72 hours of no fever.  Work note given.  Discussed follow-up and return parameters.  Discussed follow up with Primary care physician this week. Discussed follow up and return parameters including no resolution or any worsening concerns. Patient verbalized understanding and agreed to plan.   ____________________________________________   FINAL CLINICAL IMPRESSION(S) / ED DIAGNOSES  Final diagnoses:  Cough  Fever, unspecified  Advice Given About Covid-19 Virus Infection     ED Discharge Orders    None       Note: This dictation was prepared with Dragon dictation along with smaller phrase technology. Any transcriptional errors that result from this process  are unintentional.         Renford Dills, NP 01/22/19 [phone]

## 2019-01-22 NOTE — ED Triage Notes (Signed)
Patient c/o low grade fever and cough that started on Friday.  She states she has not had a fever since Saturday. She needs a note to return to work.

## 2019-01-22 NOTE — Discharge Instructions (Signed)
Rest. Drink plenty of fluids. Over the counter medication as needed.  Please refer to [facility] recommendations and please adhere.  Remain home.  Follow up with your primary care physician this week as needed. Return to Urgent care for new or worsening concerns.

## 2019-03-16 ENCOUNTER — Encounter: Payer: Self-pay | Admitting: Emergency Medicine

## 2019-03-16 ENCOUNTER — Other Ambulatory Visit: Payer: Self-pay

## 2019-03-16 ENCOUNTER — Ambulatory Visit
Admission: EM | Admit: 2019-03-16 | Discharge: 2019-03-16 | Disposition: A | Discharge status: Home / Self Care | Payer: BC Managed Care – PPO

## 2019-03-16 DIAGNOSIS — Z569 Unspecified problems related to employment: Secondary | ICD-10-CM

## 2019-03-16 DIAGNOSIS — F410 Panic disorder [episodic paroxysmal anxiety] without agoraphobia: Secondary | ICD-10-CM | POA: Diagnosis not present

## 2019-03-16 HISTORY — DX: Unspecified chronic bronchitis: J42

## 2019-03-16 NOTE — Discharge Instructions (Signed)
It was very nice seeing you today in clinic. Thank you for entrusting me with your care.   Note provided for work regarding face mask.   Make arrangements to follow up with your regular doctor in 1 week for re-evaluation if needed.  If your symptoms/condition worsens, please seek follow up care either here or in the ER. Please remember, our [place] providers are "right here with you" when you need [place].   Again, it was my pleasure to take care of you today. Thank you for choosing our clinic. I hope that you start to feel better quickly.   Honor Loh, MSN, APRN, FNP-C, CEN Advanced Practice Provider [facility]

## 2019-03-16 NOTE — ED Provider Notes (Signed)
[address] [place], [place] [address] [phone]   Name: Courtney [name] DOB: 1975-11-30 MRN: [id number] CSN: [id number] PCP: Juline Patch, MD  Arrival date and time:  03/16/19 1542  Chief Complaint:  Anxiety  NOTE: Prior to seeing the patient today, I have reviewed the triage nursing documentation and vital signs. Clinical staff has updated patient's PMH/PSHx, current medication list, and drug allergies/intolerances to ensure comprehensive history available to assist in medical decision making.   History:   HPI: Courtney [name] is a 43 y.o. female who presents today with complaints of increased anxiety related to current mandatory face masking requirements imposed by her workplace. In the wake of the COVID-19 pandemic, patient's employer has made constant adjustments to her working condition. Patient citing that 3 people were recently diagnosed as SARS-CoV-2 (+) at her job, thus prompting her employer to move towards mandatory universal masking. Patient states, "they are taking it to far. The people that were sick were not even in my building".   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 PMH (+) for chronic bronchitis. She is a 2 ppd smoker. Patient advises that today while at work, she became very anxious and started to cough. She used her prescribed SABA (albuterol) MDI. Patient states, "I hit my inhaler 4 or 5 times back to back". As expected, overuse of the SABA medicaion caused subsequent elevation in her HR to the 140s, however she advises that the medication did little to improve her breathing. With the carbon dioxide re-breathe associated with prolonged wearing of her face mask, Courtney [name] that she became light-headed and felt pre-syncopal.  Patient states, "I felt like I was suffocating. I was afraid that I was going to pass out and hit my head on one of the machines. I am afraid".   Patient  sent to [facility] for further evaluation. No chest pain or increased SOB at time of clinic visit. She is noted to be anxious. HR 108. SPO2 99% on room air. She is requesting documentation of her condition to provide to her employer that will preclude her from the masking requirements enacted by her employer.   Past Medical History:  Diagnosis Date  . Chronic bronchitis ([place])   . Thyroid disease     Past Surgical History:  Procedure Laterality Date  . ABDOMINAL HYSTERECTOMY      Family History  Problem Relation Age of Onset  . Diabetes Mother   . Cancer Other   . Pulmonary embolism Sister   . Deep vein thrombosis Sister     Social History   Socioeconomic History  . Marital status: Married    Spouse name: Not on file  . Number of children: Not on file  . Years of education: Not on file  . Highest education level: Not on file  Occupational History  . Not on file  Social Needs  . Financial resource strain: Not on file  . Food insecurity    Worry: Not on file    Inability: Not on file  . Transportation needs    Medical: Not on file    Non-medical: Not on file  Tobacco Use  . Smoking status: Current Every Day Smoker    Packs/day: 2.00  . Smokeless tobacco: Never Used  Substance and Sexual Activity  . Alcohol use: Yes    Comment: occasionally  . Drug use: No  . Sexual activity: Not on file  Lifestyle  .  Physical activity    Days per week: Not on file    Minutes per session: Not on file  . Stress: Not on file  Relationships  . Social Musicianconnections    Talks on phone: Not on file    Gets together: Not on file    Attends religious service: Not on file    Active member of club or organization: Not on file    Attends meetings of clubs or organizations: Not on file    Relationship status: Not on file  . Intimate partner violence    Fear of current or ex partner: Not on file    Emotionally abused: Not on file    Physically abused: Not on file    Forced sexual activity: Not  on file  Other Topics Concern  . Not on file  Social History Narrative  . Not on file    There are no active problems to display for this patient.   Home Medications:    Current Meds  Medication Sig  . albuterol (PROVENTIL HFA;VENTOLIN HFA) 108 (90 Base) MCG/ACT inhaler Inhale 2 puffs into the lungs every 4 (four) hours as needed for wheezing or shortness of breath.  . levothyroxine (SYNTHROID, LEVOTHROID) 112 MCG tablet     Allergies:   Patient has no known allergies.  Review of Systems (ROS): Review of Systems  Constitutional: Negative for chills and fever.  HENT: Negative for congestion, rhinorrhea, sinus pressure and sinus pain.   Respiratory: Positive for cough and shortness of breath.        2 ppd smoker. PMH (+) chronic bronchitis.   Cardiovascular: Negative for chest pain and palpitations.  Gastrointestinal: Negative for abdominal pain, nausea and vomiting.  Endocrine:       PMH (+) for HYPOthyroidism - on levothyroxine  Musculoskeletal: Negative for back pain and neck pain.  Skin: Negative for color change.  Neurological: Positive for dizziness (feelings of pre-syncope), light-headedness and headaches. Negative for seizures and weakness.  Psychiatric/Behavioral: The patient is nervous/anxious.      Physical Exam:  Triage Vital Signs ED Triage Vitals  Enc Vitals Group     BP 03/16/19 1555 119/73     Pulse Rate 03/16/19 1555 (!) 108     Resp 03/16/19 1555 18     Temp 03/16/19 1555 99.2 F (37.3 C)     Temp Source 03/16/19 1555 Oral     SpO2 03/16/19 1555 99 %     Weight 03/16/19 1556 147 lb (66.7 kg)     Height 03/16/19 1556 5\' 8"  (1.727 m)     Head Circumference --      Peak Flow --      Pain Score 03/16/19 1556 5     Pain Loc --      Pain Edu? --      Excl. in GC? --     Physical Exam  Constitutional: She is oriented to person, place, and time and well-developed, well-nourished, and in no distress.  HENT:  Head: Normocephalic and atraumatic.   Mouth/Throat: Oropharynx is clear and moist and mucous membranes are normal.  Eyes: Pupils are equal, round, and reactive to light. EOM are normal.  Neck: Normal range of motion. Neck supple. No tracheal deviation present.  Cardiovascular: Regular rhythm, normal heart sounds and intact distal pulses. Tachycardia present. Exam reveals no gallop and no friction rub.  No murmur heard. Pulmonary/Chest: Effort normal. No respiratory distress. She has wheezes (faint expiratory wheezing in BILATERAL upper lung lobes). She  has no rales.  (+) dry cough  Neurological: She is alert and oriented to person, place, and time. She has normal sensation, normal strength, normal reflexes and intact cranial nerves. Gait normal. GCS score is 15.  Skin: Skin is warm and dry. No rash noted. No erythema.  Psychiatric: Memory, affect and judgment normal. Her mood appears anxious.  Nursing note and vitals reviewed.    Urgent Care Treatments / Results:   LABS: PLEASE NOTE: all labs that were ordered this encounter are listed, however only abnormal results are displayed. Labs Reviewed - No data to display  EKG: -None  RADIOLOGY: No results found.  PROCEDURES: Procedures   MEDICATIONS RECEIVED THIS VISIT: Medications - No data to display  PERTINENT CLINICAL COURSE NOTES/UPDATES:   Initial Impression / Assessment and Plan / Urgent Care Course:    Courtney [name] is a 43 y.o. female who [facility] today with complaints of Anxiety  Pertinent labs & imaging results that were available during my care of the patient were personally reviewed by me and considered in my medical decision making (see lab/imaging section of note for values and interpretations).  Patient overall well appearing and in no acute distress today in clinic. She is anxious. HR 108 and SPO2 99% on RA. Exam reveals faint expiratory wheezing in the upper lobes BILATERALLY.  Her tachycardia is  attributed to stacked uses (4-5  puffs) of albuterol, coupled with her obvious anxiety. Patient wearing face mask, but notes that she feels like she is "suffocating". While I remained masked, the patient was allowed to remove her mask, which resulted in a dramatic decrease in the patient's anxiety. Judging from the circumstances reported that occurred prior to her arrival, patient seems to have experienced a panic attack. She states, "I just could not calm down. I would not breathe. It was bad". Patient to continue PRN use of her albuterol MDI as previously prescribed. Educated on proper use of SABA inhaler in prevent negatively associated side effects. Briefly discussed smoking cessation, however patient has no immediate plans on quitting at this time.   In light of patient's presenting symptoms, coupled with the reported working conditions at her job (hot/humid; no air conditioning), I feel that it is reasonable to have her avoid masking at work. Wearing face masks for prolonged periods make it difficult for her to breathe, which in turn causes her to be anxious. She is aware of the recommendations in place from [facility] regarding masking in the midst of the current COVID-19 pandemic. The risks of her wearing a mask outweigh the benefits for her in her mind, and she knowingly accepts the risks associated with not masking. With the documented stipulation that the patient adheres to proper social distancing in place, I have provided her with documentation asking that she be accommodated during her hours spent at work only.   Discussed follow up with primary care physician in 1 week for re-evaluation if needed for continued anxiety and SOB. I have reviewed the follow up and strict return precautions for any new or worsening symptoms. Patient is aware of symptoms that would be deemed urgent/emergent, and would thus require further evaluation either here or in the emergency department. At the time of discharge, she verbalized understanding and  consent with the discharge plan as it was reviewed with her. All questions were fielded by provider and/or clinic staff prior to patient discharge.    Final Clinical Impressions(s) / Urgent Care Diagnoses:   Final  diagnoses:  Work environment adverse effect  Panic attack    New Prescriptions:  No orders of the defined types were placed in this encounter.   Controlled Substance Prescriptions:  [place] Controlled Substance Registry consulted? Not Applicable  NOTE: This note was prepared using Dragon dictation software along with smaller phrase technology. Despite my best ability to proofread, there is the potential that transcriptional errors may still occur from this process, and are completely unintentional.     Verlee MonteGray, Chick Cousins E, NP 03/16/19 2353

## 2019-03-16 NOTE — ED Triage Notes (Signed)
Patient states her job is requiring her to wear a mask now at work. She is asking for a note to not be able to wear her mask at certain parts of the day. She states she can't breath in the mask due to chronic bronchitis and shortness of breath.

## 2019-04-10 ENCOUNTER — Other Ambulatory Visit: Payer: BC Managed Care – PPO

## 2019-04-10 ENCOUNTER — Telehealth: Payer: Self-pay

## 2019-04-10 DIAGNOSIS — Z20822 Contact with and (suspected) exposure to covid-19: Secondary | ICD-10-CM

## 2019-04-10 NOTE — Addendum Note (Signed)
Addended by: Benson Setting L on: 04/10/2019 02:00 PM   Modules accepted: Orders

## 2019-04-10 NOTE — Telephone Encounter (Signed)
Spoke with pt and she has been scheduled for today at the [facility] test site for 2:30. Pt is aware to remain in her car, when I informed pt she needs to wear a mask she states she has a letter from her physician permitting her from wearing one. I informed pt to bring it with her and to inform staff at testing site of this information. She had no additional questions at this time. Nothing further is needed  Test was ordered

## 2019-04-10 NOTE — Telephone Encounter (Signed)
Received call from patient with concerns related to COVID-19. Patient states her husband has been in close contact with a positive person and she noted that a mild cough started on 04/09/19 as well as some mild muscle aches and pains. Patient resting to be tested for COVID 19

## 2019-04-15 LAB — NOVEL CORONAVIRUS, NAA: SARS-CoV-2, NAA: NOT DETECTED

## 2019-04-16 ENCOUNTER — Telehealth: Payer: Self-pay

## 2019-04-16 NOTE — Telephone Encounter (Signed)
Notified patient her COVID-19 results are negative. Patient states she has no symptoms at this time. Cleared to return to work.

## 2019-07-19 ENCOUNTER — Other Ambulatory Visit: Payer: Self-pay

## 2019-07-19 DIAGNOSIS — Z20822 Contact with and (suspected) exposure to covid-19: Secondary | ICD-10-CM

## 2019-07-21 LAB — NOVEL CORONAVIRUS, NAA: SARS-CoV-2, NAA: NOT DETECTED

## 2019-07-22 ENCOUNTER — Telehealth: Payer: Self-pay

## 2019-07-22 LAB — POCT LIPID PANEL
HDL: 41
LDL: 98
Non-HDL: 129
POC Glucose: 146 mg/dl — AB (ref 70–99)
TC/HDL: 4.1
TC: 170
TRG: 153

## 2019-07-22 NOTE — Telephone Encounter (Signed)
Received call from patient stating she was not aware of her results from her COVID-19 test; NEGATIVE results given at this time. Patient states that she feels somewhat better but continues to have intermittent abdominal cramps.

## 2019-08-06 ENCOUNTER — Other Ambulatory Visit: Payer: Self-pay

## 2019-08-06 DIAGNOSIS — Z008 Encounter for other general examination: Secondary | ICD-10-CM

## 2019-08-06 NOTE — Progress Notes (Signed)
     Patient ID: Courtney [name], female    DOB: 09-Jul-1976, 43 y.o.   MRN: [id number]    Thank you!!  Apolonio Schneiders RN  [facility] Nurse Specialist [place]: [phone]  Cell:  [phone] Website: [email or web address]

## 2019-10-10 ENCOUNTER — Ambulatory Visit: Payer: BC Managed Care – PPO | Attending: Internal Medicine

## 2019-10-10 DIAGNOSIS — Z20822 Contact with and (suspected) exposure to covid-19: Secondary | ICD-10-CM

## 2019-10-11 LAB — NOVEL CORONAVIRUS, NAA: SARS-CoV-2, NAA: NOT DETECTED

## 2019-10-12 ENCOUNTER — Telehealth: Payer: Self-pay

## 2019-10-12 NOTE — Telephone Encounter (Signed)
Ensured that patient was aware of COVID-19 results. States she doesn't feel sick but she doesn't feel good. Advised she should follow up with her PCP.

## 2019-11-12 ENCOUNTER — Ambulatory Visit: Payer: BC Managed Care – PPO | Attending: Internal Medicine

## 2019-11-12 DIAGNOSIS — Z20822 Contact with and (suspected) exposure to covid-19: Secondary | ICD-10-CM

## 2019-11-13 LAB — NOVEL CORONAVIRUS, NAA: SARS-CoV-2, NAA: NOT DETECTED

## 2019-11-15 ENCOUNTER — Other Ambulatory Visit: Payer: Self-pay

## 2019-11-15 ENCOUNTER — Ambulatory Visit
Admission: EM | Admit: 2019-11-15 | Discharge: 2019-11-15 | Disposition: A | Discharge status: Home / Self Care | Payer: BC Managed Care – PPO | Attending: Urgent Care | Admitting: Urgent Care

## 2019-11-15 ENCOUNTER — Ambulatory Visit (INDEPENDENT_AMBULATORY_CARE_PROVIDER_SITE_OTHER): Payer: BC Managed Care – PPO

## 2019-11-15 DIAGNOSIS — R05 Cough: Secondary | ICD-10-CM

## 2019-11-15 DIAGNOSIS — R059 Cough, unspecified: Secondary | ICD-10-CM

## 2019-11-15 DIAGNOSIS — J209 Acute bronchitis, unspecified: Secondary | ICD-10-CM | POA: Diagnosis not present

## 2019-11-15 MED ORDER — PREDNISONE 20 MG PO TABS: 40.0000 mg | ORAL_TABLET | Freq: Every day | ORAL | 0 refills | Status: DC

## 2019-11-15 MED ORDER — AZITHROMYCIN 250 MG PO TABS: 250.0000 mg | ORAL_TABLET | Freq: Every day | ORAL | 0 refills | Status: DC

## 2019-11-15 MED ORDER — BENZONATATE 100 MG PO CAPS: 100.0000 mg | ORAL_CAPSULE | Freq: Three times a day (TID) | ORAL | 0 refills | Status: DC

## 2019-11-15 NOTE — ED Triage Notes (Signed)
Patient states that she was sent home from work on Sunday for a fever. Patient states that she was tested on Monday at [facility] and was negative for Covid. Patient states that she has been having a cough and and chest congestion and is concerned she may have bronchitis. Patient states that she also has no appetite.

## 2019-11-15 NOTE — ED Provider Notes (Signed)
[place], [place]   Name: Courtney [name] DOB: 1976-02-14 MRN: [id number] CSN: [id number] PCP: Duanne Limerick, MD  Arrival date and time:  11/15/19 1103  Chief Complaint:  Fever   NOTE: Prior to seeing the patient today, I have reviewed the triage nursing documentation and vital signs. Clinical staff has updated patient's PMH/PSHx, current medication list, and drug allergies/intolerances to ensure comprehensive history available to assist in medical decision making.   History:   HPI: Courtney [name] is a 44 y.o. female who presents today with complaints of cough and chest congestion that started approximately 5 days ago. Patient has had fevers as high as 103.5. Cough has been  Intermittently productive with no associated shortness of breath or wheezing. She denies a PMH of COPD, asthma, or seasonal allergies. 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 PMH (+) for chronic bronchitis. She is a 2 ppd smoker. Cough is worse at night and when supine. She has been using her prescribed SABA (albuterol) MDI with little to no improvement. She denies that she has experienced any nausea, vomiting, diarrhea, or abdominal pain. She notes a decreased appetite overall, however maintains the ability to tolerate oral fluids. Patient denies any perceived alterations to her sense of taste or smell. Patient presents out of concerns for her personal health, and as part of occupational requirements for her job at [employer], after being exposed to coworkers who have tested positive for SARS-CoV-2 (novel coronavirus). Patient advising that her job will not allow her to return to work without a Arboriculturist. She has been tested for SARS-CoV-2 (novel coronavirus) in the past 14 days; last tested negative on 11/12/2019. No one else is her home has experienced a similar symptom constellation. Patient has not been vaccinated for influenza this season. Despite her  symptoms, patient has not taken any over the counter interventions to help improve/relieve her reported symptoms at home.   Past Medical History:  Diagnosis Date  . Chronic bronchitis ([facility])   . Thyroid disease     Past Surgical History:  Procedure Laterality Date  . ABDOMINAL HYSTERECTOMY      Family History  Problem Relation Age of Onset  . Diabetes Mother   . Cancer Other   . Pulmonary embolism Sister   . Deep vein thrombosis Sister     Social History   Tobacco Use  . Smoking status: Current Every Day Smoker    Packs/day: 2.00  . Smokeless tobacco: Never Used  Substance Use Topics  . Alcohol use: Yes    Comment: occasionally  . Drug use: No    There are no problems to display for this patient.   Home Medications:    Current Meds  Medication Sig  . albuterol (PROVENTIL HFA;VENTOLIN HFA) 108 (90 Base) MCG/ACT inhaler Inhale 2 puffs into the lungs every 4 (four) hours as needed for wheezing or shortness of breath.  . levothyroxine (SYNTHROID, LEVOTHROID) 112 MCG tablet     Allergies:   Patient has no known allergies.  Review of Systems (ROS): Review of Systems  Constitutional: Positive for fever. Negative for fatigue.  HENT: Positive for congestion. Negative for ear pain, postnasal drip, rhinorrhea, sinus pressure, sinus pain, sneezing and sore throat.   Eyes: Negative for pain, discharge and redness.  Respiratory: Positive for cough. Negative for chest tightness and shortness of breath.        2 ppd smoker. PMH (+) chronic bronchitis.  Cardiovascular: Negative  for chest pain and palpitations.  Gastrointestinal: Negative for abdominal pain, diarrhea, nausea and vomiting.  Endocrine:       PMH (+) for HYPOthyroidism - on levothyroxine.  Musculoskeletal: Negative for arthralgias, back pain, myalgias and neck pain.  Skin: Negative for color change, pallor and rash.  Neurological: Negative for dizziness, syncope, weakness and headaches.  Hematological: Negative  for adenopathy.     Vital Signs: Today's Vitals   11/15/19 1130 11/15/19 1132  BP:  121/79  Pulse:  77  Resp:  16  Temp:  98.5 F (36.9 C)  TempSrc:  Oral  SpO2:  98%  Weight: 158 lb (71.7 kg)   Height: 5\' 9"  (1.753 m)   PainSc: 3      Physical Exam: Physical Exam  Constitutional: She is oriented to person, place, and time and well-developed, well-nourished, and in no distress.  HENT:  Head: Normocephalic and atraumatic.  Nose: Nose normal.  Mouth/Throat: Uvula is midline and oropharynx is clear and moist.  Eyes: Pupils are equal, round, and reactive to light.  Cardiovascular: Normal rate, regular rhythm, normal heart sounds and intact distal pulses.  Pulmonary/Chest: Effort normal. She has wheezes (scattered expiratory). She has rhonchi (scattered coarse; clears somewhat with forced cough).  Mild cough noted in clinic. No SOB or increased WOB. No distress. Able to speak in complete sentences without difficulties. SPO2 98% on RA.  Neurological: She is alert and oriented to person, place, and time. Gait normal.  Skin: Skin is warm and dry. No rash noted. She is not diaphoretic.  Psychiatric: Mood, memory, affect and judgment normal.  Nursing note and vitals reviewed.   Urgent Care Treatments / Results:   Orders Placed This Encounter  Procedures  . DG Chest 2 View    LABS: PLEASE NOTE: all labs that were ordered this encounter are listed, however only abnormal results are displayed. Labs Reviewed - No data to display  EKG: -None  RADIOLOGY: DG Chest 2 View  Result Date: 11/15/2019 CLINICAL DATA:  Cough and fever.  COVID-19 positive EXAM: CHEST - 2 VIEW COMPARISON:  12/18/2018 FINDINGS: The heart size and mediastinal contours are within normal limits. Both lungs are clear. The visualized skeletal structures are unremarkable. IMPRESSION: No active cardiopulmonary disease. Electronically Signed   By: 12/20/2018 M.D.   On: 11/15/2019 11:56     PROCEDURES: Procedures  MEDICATIONS RECEIVED THIS VISIT: Medications - No data to display  PERTINENT CLINICAL COURSE NOTES/UPDATES:   Initial Impression / Assessment and Plan / Urgent Care Course:  Pertinent labs & imaging results that were available during my care of the patient were personally reviewed by me and considered in my medical decision making (see lab/imaging section of note for values and interpretations).  DAIELLE MELCHER is a 44 y.o. female who presents to [facility] today with complaints of Fever  Patient overall well appearing and in no acute distress today in clinic. Presenting symptoms (see HPI) and exam as documented above. Symptoms persistent since Sunday. (+) fevers; Tmax 103.5. Last documented fever was yesterday; low grade. Patient tested (-) for SARS-CoV-2 (novel coronavirus) on 11/12/2019. Cough worse at night. Radiographs of the chest performed today revealed no acute cardiopulmonary process; no evidence of peribronchial thickening, areas of consolidation, or focal infiltrates. Exam consistent with ABR. Will treat with a 5 day course of azithromycin and systemic steroids. Discussed supportive care measures at home during acute phase of illness. Patient to rest as much as possible. She was encouraged to ensure  adequate hydration (water and ORS) to prevent dehydration and electrolyte derangements. Patient may use APAP and/or IBU on an as needed basis for pain/fever. Will send in a supply of benzonatate (Tessalon) for patient to use on a PRN basis to help with her cough.   Current clinical condition warrants patient being out of work in order to recover from current illness. She was provided with the appropriate documentation to provide to her place of employment that will allow for her to RTW on 11/18/2019 with no restrictions.   Discussed follow up with primary care physician in 1 week for re-evaluation. I have reviewed the follow up and strict return  precautions for any new or worsening symptoms. Patient is aware of symptoms that would be deemed urgent/emergent, and would thus require further evaluation either here or in the emergency department. At the time of discharge, she verbalized understanding and consent with the discharge plan as it was reviewed with her. All questions were fielded by provider and/or clinic staff prior to patient discharge.    Final Clinical Impressions / Urgent Care Diagnoses:   Final diagnoses:  Acute bronchitis, unspecified organism  Cough    New Prescriptions:  [place] Controlled Substance Registry consulted? Not Applicable  Meds ordered this encounter  Medications  . predniSONE (DELTASONE) 20 MG tablet    Sig: Take 2 tablets (40 mg total) by mouth daily.    Dispense:  10 tablet    Refill:  0  . azithromycin (ZITHROMAX) 250 MG tablet    Sig: Take 1 tablet (250 mg total) by mouth daily. Take first 2 tablets together, then 1 every day until finished.    Dispense:  6 tablet    Refill:  0    Dx: R05  . benzonatate (TESSALON) 100 MG capsule    Sig: Take 1 capsule (100 mg total) by mouth every 8 (eight) hours.    Dispense:  21 capsule    Refill:  0    Recommended Follow up Care:  Patient encouraged to follow up with the following provider within the specified time frame, or sooner as dictated by the severity of her symptoms. As always, she was instructed that for any urgent/emergent care needs, she should seek care either here or in the emergency department for more immediate evaluation.  Follow-up Information    Juline Patch, MD In 1 week.   Specialty: Family Medicine Why: General reassessment of symptoms if not improving Contact information: [address] [place] [address] [phone]         NOTE: This note was prepared using Dragon dictation software along with smaller phrase technology. Despite my best ability to proofread, there is the potential that transcriptional errors  may still occur from this process, and are completely unintentional.    Karen Kitchens, NP 11/15/19 1255

## 2019-11-15 NOTE — Discharge Instructions (Addendum)
It was very nice seeing you today in clinic. Thank you for entrusting me with your care.   Rest and increase fluid intake. Use medications as prescribed. May use Tylenol and/or Ibuprofen as needed for pain/fever.  Make arrangements to follow up with your regular doctor in 1 week for re-evaluation if not improving. If your symptoms/condition worsens, please seek follow up care either here or in the ER. Please remember, our [facility] providers are "right here with you" when you need us.   Again, it was my pleasure to take care of you today. Thank you for choosing our clinic. I hope that you start to feel better quickly.   Febe Champa, MSN, APRN, FNP-C, CEN Advanced Practice Provider [facility] 

## 2020-01-15 ENCOUNTER — Ambulatory Visit (INDEPENDENT_AMBULATORY_CARE_PROVIDER_SITE_OTHER): Payer: BC Managed Care – PPO

## 2020-01-15 ENCOUNTER — Ambulatory Visit
Admission: EM | Admit: 2020-01-15 | Discharge: 2020-01-15 | Disposition: A | Discharge status: Home / Self Care | Payer: BC Managed Care – PPO

## 2020-01-15 ENCOUNTER — Other Ambulatory Visit: Payer: Self-pay

## 2020-01-15 DIAGNOSIS — R05 Cough: Secondary | ICD-10-CM

## 2020-01-15 DIAGNOSIS — M25551 Pain in right hip: Secondary | ICD-10-CM

## 2020-01-15 DIAGNOSIS — M940 Chondrocostal junction syndrome [Tietze]: Secondary | ICD-10-CM

## 2020-01-15 MED ORDER — PREDNISONE 10 MG (21) PO TBPK: ORAL_TABLET | Freq: Every day | ORAL | 0 refills | Status: DC

## 2020-01-15 MED ORDER — HYDROCODONE-ACETAMINOPHEN 5-325 MG PO TABS: 1.0000 | ORAL_TABLET | Freq: Two times a day (BID) | ORAL | 0 refills | Status: DC | PRN

## 2020-01-15 NOTE — ED Provider Notes (Signed)
[place], [place]   Name: Courtney [name] DOB: [birth year] MRN: [id number] CSN: [id number] PCP: Duanne Limerick, MD  Arrival date and time:  01/15/20 [phone]  Chief Complaint:  Hip Pain (right)  NOTE: Prior to seeing the patient today, I have reviewed the triage nursing documentation and vital signs. Clinical staff has updated patient's PMH/PSHx, current medication list, and drug allergies/intolerances to ensure comprehensive history available to assist in medical decision making.   History:   HPI: Courtney [name] is a 44 y.o. female who presents today with complaints of pain and intermittent paresthesias in her RIGHT hip. Patient reports that these symptoms have been present since last week. She denies injury. Over the weekend, anterior hip became tender to palpation and started to swell some. She reports that pain radiates into the anterior aspect of her lower extremity and extends distally into the foot. Pain is present in all positions, but worse with standing and ambulation. Patient reports that she stands for most of the day at work on a concrete floor. Additionally, patient reporting that her breathing is "off". She notes pain in her posterior ribs with certain movements and deep inspiration. She reports that she has been breathing shallowly. Despite her symptoms, patient has not taken any over the counter interventions to help improve/relieve her reported symptoms at home.   Past Medical History:  Diagnosis Date  . Chronic bronchitis (HCC)   . Thyroid disease     Past Surgical History:  Procedure Laterality Date  . ABDOMINAL HYSTERECTOMY      Family History  Problem Relation Age of Onset  . Diabetes Mother   . Cancer Other   . Pulmonary embolism Sister   . Deep vein thrombosis Sister     Social History   Tobacco Use  . Smoking status: Current Every Day Smoker    Packs/day: 2.00  . Smokeless tobacco: Never Used  Substance Use Topics  . Alcohol use: Yes    Comment:  occasionally  . Drug use: No    There are no problems to display for this patient.   Home Medications:    Current Meds  Medication Sig  . albuterol (PROVENTIL HFA;VENTOLIN HFA) 108 (90 Base) MCG/ACT inhaler Inhale 2 puffs into the lungs every 4 (four) hours as needed for wheezing or shortness of breath.  . APPLE CIDER VINEGAR PO Take by mouth.  . calcium citrate-vitamin D (CITRACAL+D) 315-200 MG-UNIT tablet Take 1 tablet by mouth 2 (two) times daily.  Marland Kitchen levothyroxine (SYNTHROID, LEVOTHROID) 112 MCG tablet   . Multiple Vitamin (MULTIVITAMIN) tablet Take 1 tablet by mouth daily.    Allergies:   Patient has no known allergies.  Review of Systems (ROS):  Review of systems NEGATIVE unless otherwise noted in narrative H&P section.   Vital Signs: Today's Vitals   01/15/20 0843 01/15/20 0845 01/15/20 0847 01/15/20 1024  BP:   113/69   Pulse:   67   Resp:   18   Temp:   98.5 F (36.9 C)   TempSrc:   Oral   SpO2:   100%   Weight:  160 lb (72.6 kg)    Height:  5\' 8"  (1.727 m)    PainSc: 10-Worst pain ever   10-Worst pain ever    Physical Exam: Physical Exam  Constitutional: She is oriented to person, place, and time and well-developed, well-nourished, and in no distress.  HENT:  Head: Normocephalic and atraumatic.  Eyes: Pupils are equal, round, and reactive to light.  Cardiovascular:  Normal rate, regular rhythm, normal heart sounds and intact distal pulses.  Pulmonary/Chest: Effort normal and breath sounds normal. She exhibits no tenderness, no crepitus, no deformity and no swelling.  Mild cough noted in clinic. No SOB or increased WOB. No distress. Able to speak in complete sentences without difficulties. SPO2 100% on RA.  Musculoskeletal:     Thoracic back: Pain present. No swelling, edema, spasms or tenderness.       Back:     Right hip: Tenderness present. No swelling, deformity or crepitus. Normal range of motion. Normal strength.       Legs:     Comments: (+) PMS  noted distally; normal color, temperature, and capillary refill. No claudication pain with dorsiflexion of the foot or with ambulation.   Neurological: She is alert and oriented to person, place, and time. She has normal sensation, normal strength and normal reflexes. Gait normal.  Skin: Skin is warm and dry. No rash noted. She is not diaphoretic.  Psychiatric: Mood, memory, affect and judgment normal.  Nursing note and vitals reviewed.   Urgent Care Treatments / Results:   Orders Placed This Encounter  Procedures  . DG Chest 2 View  . DG Hip Unilat W or Wo Pelvis 2-3 Views Right    LABS: PLEASE NOTE: all labs that were ordered this encounter are listed, however only abnormal results are displayed. Labs Reviewed - No data to display  EKG: -None  RADIOLOGY: DG Chest 2 View Result Date: 01/15/2020 CLINICAL DATA:  Chest pain. EXAM: CHEST - 2 VIEW COMPARISON:  11/15/2019 FINDINGS: The cardiac silhouette, mediastinal and hilar contours are within normal limits. The lungs are clear. No pleural effusions. No pulmonary lesions. The bony thorax is intact. IMPRESSION: No acute cardiopulmonary findings. Electronically Signed   By: Marijo Sanes M.D.   On: 01/15/2020 10:23   DG Hip Unilat W or Wo Pelvis 2-3 Views Right Result Date: 01/15/2020 CLINICAL DATA:  Right hip pain. EXAM: DG HIP (WITH OR WITHOUT PELVIS) 2-3V RIGHT COMPARISON:  None. FINDINGS: Both hips are normally located. No fracture or plain film evidence of AVN. The pubic symphysis and SI joints are intact. No pelvic fractures or bone lesions. The SI joints appear normal. IMPRESSION: No acute bony findings or degenerative changes. Electronically Signed   By: Marijo Sanes M.D.   On: 01/15/2020 10:24   PROCEDURES: Procedures  MEDICATIONS RECEIVED THIS VISIT: Medications - No data to display  PERTINENT CLINICAL COURSE NOTES/UPDATES:   Initial Impression / Assessment and Plan / Urgent Care Course:  Pertinent labs & imaging results  that were available during my care of the patient were personally reviewed by me and considered in my medical decision making (see lab/imaging section of note for values and interpretations).  Courtney [name] is a 44 y.o. female who presents to [facility] today with complaints of Hip Pain (right)  Patient is well appearing overall in clinic today. She does not appear to be in any acute distress. Presenting symptoms (see HPI) and exam as documented above. Patient with pain in her RIGHT hip with (+) radiation into her distal RLE. She also has pain in her posterior ribs associated with cough and deep breathing. Will treat with a systemic steroid taper (prednisone). Encouraged rest and heat application. Patient offered Tramadol for moderate pain, however she notes that it does nothing for her and that she has "several prescriptions" at home that she has not filled. Will send in a short course of Norco 5/325  mg for PRN use for severe pain.   Current clinical condition warrants patient being out of work in order to recover from her current injury/illness. She was provided with the appropriate documentation to provide to her place of employment that will allow for her to RTW on 01/18/2020 with no restrictions.   Discussed follow up with primary care physician in 1 week for re-evaluation. I have reviewed the follow up and strict return precautions for any new or worsening symptoms. Patient is aware of symptoms that would be deemed urgent/emergent, and would thus require further evaluation either here or in the emergency department. At the time of discharge, she verbalized understanding and consent with the discharge plan as it was reviewed with her. All questions were fielded by provider and/or clinic staff prior to patient discharge.    Final Clinical Impressions / Urgent Care Diagnoses:   Final diagnoses:  Right hip pain  Costochondritis    New Prescriptions:  [place] Controlled Substance Registry  consulted? Yes, I have consulted the [place] Controlled Substances Registry for this patient, and feel the risk/benefit ratio today is favorable for proceeding with this prescription for a controlled substance.  . Discussed use of controlled substance medication to treat her acute pain.  o Reviewed [place] STOP Act regulations  o Clinic does not refill controlled substances over the phone without face to face evaluation.  . Safety precautions reviewed.  o Medications should not be bitten, chewed, sold, or taken with alcohol.  o Avoid use while working, driving, or operating heavy machinery.  o Side effects associated with the use of this particular medication reviewed. - Patient understands that this medication can cause CNS depression, increase her risk of falls, and even lead to overdose that may result in death, if used outside of the parameters that she and I discussed.  With all of this in mind, she knowingly accepts the risks and responsibilities associated with intended course of treatment, and elects to responsibly proceed as discussed.  Meds ordered this encounter  Medications  . predniSONE (STERAPRED UNI-PAK 21 TAB) 10 MG (21) TBPK tablet    Sig: Take by mouth daily. 60 mg x 1 day, 50 mg x 1 day, 40 mg x 1 day, 30 mg x 1 day, 20 mg x 1 day, 10 mg x 1 day    Dispense:  21 tablet    Refill:  0  . HYDROcodone-acetaminophen (NORCO) 5-325 MG tablet    Sig: Take 1 tablet by mouth 2 (two) times daily as needed for moderate pain.    Dispense:  8 tablet    Refill:  0    Recommended Follow up Care:  Patient encouraged to follow up with the following provider within the specified time frame, or sooner as dictated by the severity of her symptoms. As always, she was instructed that for any urgent/emergent care needs, she should seek care either here or in the emergency department for more immediate evaluation.  Follow-up Information    Duanne Limerick, MD In 1 week.   Specialty: Family Medicine Why:  General reassessment of symptoms if not improving Contact information: [address] [place] [address] [phone]         NOTE: This note was prepared using Dragon dictation software along with smaller phrase technology. Despite my best ability to proofread, there is the potential that transcriptional errors may still occur from this process, and are completely unintentional.    Verlee Monte, NP 01/17/20 1621

## 2020-01-15 NOTE — ED Triage Notes (Addendum)
Pt presents with c/o right hip pain starting last week. She reports the hip area has been numb and tingling. Pt states the area does seem swollen some and is painful to touch. Pt denies any known injury to the area. Pt does have pain with ambulation, sitting and standing. Pt reports the tingling feeling extends down her leg to her foot. Pt also reports a pain going up her side, her fingers getting the same tingly feeling and her breathing is "off" - which she describes as having to take short, shallow breaths with rib pain. She thinks it may all be related.

## 2020-01-15 NOTE — Discharge Instructions (Addendum)
It was very nice seeing you today in clinic. Thank you for entrusting me with your care.   Rest for the next few days. Alternate between heat and ice on the hit. Can use heating pad on your back as well. Make efforts to take frequent deep breaths to help with your breathing.   Make arrangements to follow up with your regular doctor in 1 week for re-evaluation if not improving.  If your symptoms/condition worsens, please seek follow up care either here or in the ER. Please remember, our [facility] providers are "right here with you" when you need [place].   Again, it was my pleasure to take care of you today. Thank you for choosing our clinic. I hope that you start to feel better quickly.   Quentin Mulling, MSN, APRN, FNP-C, CEN Advanced Practice Provider [facility] [place] [facility]

## 2020-03-17 ENCOUNTER — Ambulatory Visit
Admission: EM | Admit: 2020-03-17 | Discharge: 2020-03-17 | Disposition: A | Discharge status: Home / Self Care | Payer: BC Managed Care – PPO | Attending: Emergency Medicine | Admitting: Emergency Medicine

## 2020-03-17 ENCOUNTER — Other Ambulatory Visit: Payer: Self-pay

## 2020-03-17 DIAGNOSIS — B369 Superficial mycosis, unspecified: Secondary | ICD-10-CM

## 2020-03-17 MED ORDER — CLOTRIMAZOLE-BETAMETHASONE 1-0.05 % EX CREA: TOPICAL_CREAM | CUTANEOUS | 0 refills | Status: DC

## 2020-03-17 NOTE — ED Triage Notes (Signed)
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 States she didn't have the rash this morning but after getting to work, she noticed a burning sensation between her breasts. Red and irritated and burning.

## 2020-03-17 NOTE — ED Provider Notes (Signed)
[facility]    CSN: [id number] Arrival date & time: 03/17/20  1602      History   Chief Complaint Chief Complaint  Patient presents with  . Rash    HPI Courtney [name] is a 44 y.o. female.   Patient is a 44 year old female who presents with chief complaint of painful rash on her chest.  Patient states she did not notice the rash this morning she was getting dressed but about 1/2 hours after she got to work, after she had been sweating quite a bit she developed a painful rash mostly between her breast.  She was seen by a nurse at work who advised her the seen in urgent care thinking it may be a yeast infection.  Patient states he sweats a lot at work where she feels radiators.  She takes medication for her thyroid and occasional BC powder.  Ports pain about a 5 or 6 when she is moving around.  She states if it gets wet while she is sweating but the pain is much worse.  Patient has not had anything like this in the past.     Past Medical History:  Diagnosis Date  . Chronic bronchitis ([place])   . Thyroid disease     There are no problems to display for this patient.   Past Surgical History:  Procedure Laterality Date  . ABDOMINAL HYSTERECTOMY      OB History   No obstetric history on file.      Home Medications    Prior to Admission medications   Medication Sig Start Date End Date Taking? Authorizing Provider  albuterol (PROVENTIL HFA;VENTOLIN HFA) 108 (90 Base) MCG/ACT inhaler Inhale 2 puffs into the lungs every 4 (four) hours as needed for wheezing or shortness of breath. 08/17/16   Frederich Cha, MD  APPLE CIDER VINEGAR PO Take by mouth.    [provider]  calcium citrate-vitamin D (CITRACAL+D) 315-200 MG-UNIT tablet Take 1 tablet by mouth 2 (two) times daily.    [provider]  clotrimazole-betamethasone (LOTRISONE) cream Apply to affected area 2 times daily prn 03/17/20   Luvenia Redden, PA-C  HYDROcodone-acetaminophen (NORCO) 5-325  MG tablet Take 1 tablet by mouth 2 (two) times daily as needed for moderate pain. 01/15/20   Karen Kitchens, NP  levothyroxine (SYNTHROID, LEVOTHROID) 112 MCG tablet  04/23/18   [provider]  Multiple Vitamin (MULTIVITAMIN) tablet Take 1 tablet by mouth daily.    [provider]  predniSONE (STERAPRED UNI-PAK 21 TAB) 10 MG (21) TBPK tablet Take by mouth daily. 60 mg x 1 day, 50 mg x 1 day, 40 mg x 1 day, 30 mg x 1 day, 20 mg x 1 day, 10 mg x 1 day 01/15/20   Karen Kitchens, NP    Family History Family History  Problem Relation Age of Onset  . Diabetes Mother   . Cancer Other   . Pulmonary embolism Sister   . Deep vein thrombosis Sister     Social History Social History   Tobacco Use  . Smoking status: Current Every Day Smoker    Packs/day: 1.75  . Smokeless tobacco: Never Used  Vaping Use  . Vaping Use: Some days  Substance Use Topics  . Alcohol use: Yes    Comment: occasionally  . Drug use: No     Allergies   Patient has no known allergies.   Review of Systems Review of Systems as noted above in HPI.  Other systems reviewed and found be negative   Physical Exam Triage Vital Signs ED Triage Vitals  Enc Vitals Group     BP 03/17/20 1658 115/74     Pulse Rate 03/17/20 1658 89     Resp 03/17/20 1658 16     Temp 03/17/20 1658 98 F (36.7 C)     Temp Source 03/17/20 1658 Oral     SpO2 03/17/20 1658 100 %     Weight 03/17/20 1656 165 lb (74.8 kg)     Height 03/17/20 1656 5\' 8"  (1.727 m)     Head Circumference --      Peak Flow --      Pain Score 03/17/20 1656 5     Pain Loc --      Pain Edu? --      Excl. in GC? --    No data found.  Updated Vital Signs BP 115/74 (BP Location: Left Arm)   Pulse 89   Temp 98 F (36.7 C) (Oral)   Resp 16   Ht 5\' 8"  (1.727 m)   Wt 165 lb (74.8 kg)   SpO2 100%   BMI 25.09 kg/m    Physical Exam Constitutional:      Appearance: Normal appearance. She is not ill-appearing.  Cardiovascular:     Rate and  Rhythm: Normal rate and regular rhythm.  Pulmonary:     Effort: Pulmonary effort is normal. No respiratory distress.     Breath sounds: Normal breath sounds.  Skin:    General: Skin is warm.     Capillary Refill: Capillary refill takes less than 2 seconds.     Findings: Rash present.       Neurological:     General: No focal deficit present.     Mental Status: She is alert and oriented to person, place, and time.      UC Treatments / Results  Labs (all labs ordered are listed, but only abnormal results are displayed) Labs Reviewed - No data to display  EKG   Radiology No results found.  Procedures Procedures (including critical care time)  Medications Ordered in UC Medications - No data to display  Initial Impression / Assessment and Plan / UC Course  I have reviewed the triage vital signs and the nursing notes.  Pertinent labs & imaging results that were available during my care of the patient were reviewed by me and considered in my medical decision making (see chart for details).     Patient with apparent fungal rash between her breasts.  Nothing under the breast.  Give her prescription for Lotrimin cream to help with the pain in the infection.  Will recommend medicated powder to be placed into the brawl to help with moisture and help keep the area dry while she is working. Final Clinical Impressions(s) / UC Diagnoses   Final diagnoses:  Fungal rash of torso     Discharge Instructions     -Lotrisone cream: Apply to affected area twice a day -Can use over-the-counter [place] medicated powder or even over-the-counter antifungal powder, placed inside the bra prior to work.  This will help try to keep the area dry. -Ibuprofen and Tylenol as needed for pain.    ED Prescriptions    Medication Sig Dispense Auth. Provider   clotrimazole-betamethasone (LOTRISONE) cream Apply to affected area 2 times daily prn 15 g 03/19/20, PA-C     PDMP not reviewed  this encounter.   , PA-C 03/17/20  1806  

## 2020-03-17 NOTE — Discharge Instructions (Signed)
-  Lotrisone cream: Apply to affected area twice a day -Can use over-the-counter [place]bond medicated powder or even over-the-counter antifungal powder, placed inside the bra prior to work.  This will help try to keep the area dry. -Ibuprofen and Tylenol as needed for pain.

## 2020-04-21 ENCOUNTER — Other Ambulatory Visit: Payer: Self-pay

## 2020-04-21 ENCOUNTER — Encounter: Payer: Self-pay | Admitting: Emergency Medicine

## 2020-04-21 ENCOUNTER — Ambulatory Visit
Admission: EM | Admit: 2020-04-21 | Discharge: 2020-04-21 | Disposition: A | Discharge status: Home / Self Care | Payer: BC Managed Care – PPO | Attending: Family Medicine | Admitting: Family Medicine

## 2020-04-21 DIAGNOSIS — R0981 Nasal congestion: Secondary | ICD-10-CM

## 2020-04-21 DIAGNOSIS — Z20822 Contact with and (suspected) exposure to covid-19: Secondary | ICD-10-CM | POA: Diagnosis present

## 2020-04-21 DIAGNOSIS — R1084 Generalized abdominal pain: Secondary | ICD-10-CM

## 2020-04-21 DIAGNOSIS — R05 Cough: Secondary | ICD-10-CM | POA: Diagnosis present

## 2020-04-21 DIAGNOSIS — R519 Headache, unspecified: Secondary | ICD-10-CM | POA: Diagnosis not present

## 2020-04-21 DIAGNOSIS — R059 Cough, unspecified: Secondary | ICD-10-CM

## 2020-04-21 MED ORDER — KETOROLAC TROMETHAMINE 60 MG/2ML IM SOLN
60.0000 mg | Freq: Once | INTRAMUSCULAR | Status: AC
  Administered 2020-04-21: 60 mg via INTRAMUSCULAR

## 2020-04-21 NOTE — ED Triage Notes (Signed)
Patient c/o migraine, abdominal pain and cough that started 4 days ago. Reports low grade temp of 99.8 this morning. She has had positive exposure to her neighbor last week.

## 2020-04-21 NOTE — ED Provider Notes (Signed)
[facility] ____________________________________________  Time seen: Approximately 3:03 PM  I have reviewed the triage vital signs and the nursing notes.   HISTORY  Chief Complaint Migraine, Cough, and Abdominal Pain   HPI Courtney [name] is a 44 y.o. female presenting for evaluation of cough, congestion, headache and abdominal pain.  Patient reports symptoms initially started with what she describes as a diffuse migraine aching throbbing headache.  States she then noticed some accompanying congestion and cough.  Did have a sore throat for 2 days but reports has since resolved.  Also having intermittent diarrhea and diffuse abdominal pain.  Denies any 1 point abdominal location but reports diffuse.  No vomiting.  Denies chest pain or shortness of breath.  Possible change in taste, denies change in smell.  Has taken over-the-counter Tylenol and ibuprofen without resolution.  Also took a friend's Percocet which helped briefly with headache.  Denies vision changes, paresthesias, syncope, near syncope, chest pain, shortness of breath.  Also reports received phone call this morning of her neighbor and close friend that tested positive for Covid.  Duanne Limerick, MD : PCP    Past Medical History:  Diagnosis Date   Chronic bronchitis (HCC)    Thyroid disease     There are no problems to display for this patient.   Past Surgical History:  Procedure Laterality Date   ABDOMINAL HYSTERECTOMY       No current facility-administered medications for this encounter.  Current Outpatient Medications:    albuterol (PROVENTIL HFA;VENTOLIN HFA) 108 (90 Base) MCG/ACT inhaler, Inhale 2 puffs into the lungs every 4 (four) hours as needed for wheezing or shortness of breath., Disp: 1 Inhaler, Rfl: 0   clotrimazole-betamethasone (LOTRISONE) cream, Apply to affected area 2 times daily prn, Disp: 15 g, Rfl: 0   levothyroxine (SYNTHROID, LEVOTHROID) 112 MCG tablet, , Disp: , Rfl: 0    APPLE CIDER VINEGAR PO, Take by mouth., Disp: , Rfl:    calcium citrate-vitamin D (CITRACAL+D) 315-200 MG-UNIT tablet, Take 1 tablet by mouth 2 (two) times daily., Disp: , Rfl:    HYDROcodone-acetaminophen (NORCO) 5-325 MG tablet, Take 1 tablet by mouth 2 (two) times daily as needed for moderate pain., Disp: 8 tablet, Rfl: 0   Multiple Vitamin (MULTIVITAMIN) tablet, Take 1 tablet by mouth daily., Disp: , Rfl:    predniSONE (STERAPRED UNI-PAK 21 TAB) 10 MG (21) TBPK tablet, Take by mouth daily. 60 mg x 1 day, 50 mg x 1 day, 40 mg x 1 day, 30 mg x 1 day, 20 mg x 1 day, 10 mg x 1 day, Disp: 21 tablet, Rfl: 0  Allergies Patient has no known allergies.  Family History  Problem Relation Age of Onset   Diabetes Mother    Cancer Other    Pulmonary embolism Sister    Deep vein thrombosis Sister     Social History Social History   Tobacco Use   Smoking status: Current Every Day Smoker    Packs/day: 1.75   Smokeless tobacco: Never Used  Vaping Use   Vaping Use: Some days  Substance Use Topics   Alcohol use: Yes    Comment: occasionally   Drug use: No    Review of Systems Constitutional: Reports positive low-grade fever. Eyes: No visual changes. ENT: No sore throat. Cardiovascular: Denies chest pain. Respiratory: Denies shortness of breath. Gastrointestinal: Positive abdominal pain. Genitourinary: Negative for dysuria. Skin: Negative for rash. Neurological: Positive for headaches.  Negative focal weakness or numbness.   ____________________________________________  PHYSICAL EXAM:  VITAL SIGNS: ED Triage Vitals  Enc Vitals Group     BP 04/21/20 1318 110/71     Pulse Rate 04/21/20 1318 77     Resp 04/21/20 1318 18     Temp 04/21/20 1318 98.6 F (37 C)     Temp Source 04/21/20 1318 Oral     SpO2 04/21/20 1318 98 %     Weight 04/21/20 1316 165 lb (74.8 kg)     Height 04/21/20 1316 5\' 8"  (1.727 m)     Head Circumference --      Peak Flow --      Pain Score  04/21/20 1316 10     Pain Loc --      Pain Edu? --      Excl. in GC? --     Constitutional: Alert and oriented. Well appearing  Eyes: Conjunctivae are normal. PERRL. EOMI. Head: Atraumatic. No swelling. No erythema.  Ears: no erythema, normal TMs bilaterally.   Nose:Nasal congestion with clear rhinorrhea  Mouth/Throat: Mucous membranes are moist. No pharyngeal erythema. No tonsillar swelling or exudate.  Neck: No stridor.  No cervical spine tenderness to palpation. Hematological/Lymphatic/Immunilogical: No cervical lymphadenopathy. Cardiovascular: Normal rate, regular rhythm. Grossly normal heart sounds.  Good peripheral circulation. Respiratory: Normal respiratory effort.  No retractions. No wheezes, rales or rhonchi. Good air movement.  Gastrointestinal:Normal Bowel sounds.  Diffuse mild abdominal pain, no point tenderness.  Nonguarding. Musculoskeletal: Ambulatory with steady gait.  Change positions quickly in room. Neurologic:  Normal speech and language. No gait instability.  No focal neurological deficits. Skin:  Skin appears warm, dry and intact. No rash noted. Psychiatric: Mood and affect are normal. Speech and behavior are normal.  ___________________________________________   LABS (all labs ordered are listed, but only abnormal results are displayed)  Labs Reviewed  SARS CORONAVIRUS 2 (TAT 6-24 HRS)     PROCEDURES Procedures    INITIAL IMPRESSION / ASSESSMENT AND PLAN / ED COURSE  Pertinent labs & imaging results that were available during my care of the patient were reviewed by me and considered in my medical decision making (see chart for details).  Presenting for above complaints.  Positive COVID-19 exposure.  Concern for COVID-19.  COVID-19 testing ordered.  60 mg IM Toradol given once in urgent care.  Encouraged rest, fluids, Tylenol ibuprofen as needed.  Discussed very strict follow-up and return parameters.  For any worsening complaints proceed erectly to  emergency room. Discussed indication, risks and benefits of medications with patient.   Discussed follow up with Primary care physician this week. Discussed follow up and return parameters including no resolution or any worsening concerns. Patient verbalized understanding and agreed to plan.   ____________________________________________   FINAL CLINICAL IMPRESSION(S) / ED DIAGNOSES  Final diagnoses:  Nonintractable headache, unspecified chronicity pattern, unspecified headache type  Cough  Nasal congestion  Generalized abdominal pain  Exposure to COVID-19 virus     ED Discharge Orders    None       Note: This dictation was prepared with Dragon dictation along with smaller phrase technology. Any transcriptional errors that result from this process are unintentional.         04/23/20, NP 04/21/20 1635

## 2020-04-21 NOTE — Discharge Instructions (Addendum)
Rest. Drink plenty of fluids.  Over-the-counter medication as needed.  Rest.  Fluids.  Call to follow-up with your primary care as discussed.  Proceed directly to emergency room for any persisting headache, abdominal pain, inability to eat or drink or worsening complaints.

## 2020-04-22 LAB — SARS CORONAVIRUS 2 (TAT 6-24 HRS): SARS Coronavirus 2: NEGATIVE

## 2020-05-23 ENCOUNTER — Encounter: Payer: Self-pay | Admitting: Emergency Medicine

## 2020-05-23 ENCOUNTER — Ambulatory Visit (INDEPENDENT_AMBULATORY_CARE_PROVIDER_SITE_OTHER): Payer: BC Managed Care – PPO

## 2020-05-23 ENCOUNTER — Ambulatory Visit
Admission: EM | Admit: 2020-05-23 | Discharge: 2020-05-23 | Disposition: A | Discharge status: Home / Self Care | Payer: BC Managed Care – PPO | Attending: Internal Medicine | Admitting: Internal Medicine

## 2020-05-23 ENCOUNTER — Other Ambulatory Visit: Payer: Self-pay

## 2020-05-23 DIAGNOSIS — R0602 Shortness of breath: Secondary | ICD-10-CM | POA: Diagnosis present

## 2020-05-23 DIAGNOSIS — R0981 Nasal congestion: Secondary | ICD-10-CM | POA: Diagnosis not present

## 2020-05-23 DIAGNOSIS — R079 Chest pain, unspecified: Secondary | ICD-10-CM | POA: Diagnosis not present

## 2020-05-23 DIAGNOSIS — R05 Cough: Secondary | ICD-10-CM | POA: Diagnosis not present

## 2020-05-23 DIAGNOSIS — R52 Pain, unspecified: Secondary | ICD-10-CM | POA: Diagnosis not present

## 2020-05-23 DIAGNOSIS — Z20822 Contact with and (suspected) exposure to covid-19: Secondary | ICD-10-CM | POA: Insufficient documentation

## 2020-05-23 LAB — URINALYSIS, COMPLETE (UACMP) WITH MICROSCOPIC
Bacteria, UA: NONE SEEN
Bilirubin Urine: NEGATIVE
Glucose, UA: NEGATIVE mg/dL
Hgb urine dipstick: NEGATIVE
Ketones, ur: NEGATIVE mg/dL
Leukocytes,Ua: NEGATIVE
Nitrite: NEGATIVE
Protein, ur: NEGATIVE mg/dL
Specific Gravity, Urine: 1.025 (ref 1.005–1.030)
WBC, UA: NONE SEEN WBC/hpf (ref 0–5)
pH: 7 (ref 5.0–8.0)

## 2020-05-23 MED ORDER — HYDROCODONE-HOMATROPINE 5-1.5 MG/5ML PO SYRP: 5.0000 mL | ORAL_SOLUTION | Freq: Every evening | ORAL | 0 refills | Status: DC

## 2020-05-23 NOTE — ED Triage Notes (Signed)
Ambulatory O2 Sat is 100%.

## 2020-05-23 NOTE — ED Provider Notes (Signed)
[facility]    CSN: [id number] Arrival date & time: 05/23/20  1625      History   Chief Complaint Chief Complaint  Patient presents with  . Cough  . Generalized Body Aches    HPI Courtney [name] is a 44 y.o. female. who presents due to having chest congestion, chest pressure which is provoked with coghing, body aches which started 2 days ago. She was exposed to someone who tested positive for covid. Has been having mild SOB with activity. Has not been able to sleep due to cough x 2 nights and tessalon has not help. Her cough is non productive.   Past Medical History:  Diagnosis Date  . Chronic bronchitis (HCC)   . Thyroid disease     There are no problems to display for this patient.   Past Surgical History:  Procedure Laterality Date  . ABDOMINAL HYSTERECTOMY      OB History   No obstetric history on file.      Home Medications    Prior to Admission medications   Medication Sig Start Date End Date Taking? Authorizing Provider  albuterol (PROVENTIL HFA;VENTOLIN HFA) 108 (90 Base) MCG/ACT inhaler Inhale 2 puffs into the lungs every 4 (four) hours as needed for wheezing or shortness of breath. 08/17/16   Hassan Rowan, MD  APPLE CIDER VINEGAR PO Take by mouth.    [provider]  calcium citrate-vitamin D (CITRACAL+D) 315-200 MG-UNIT tablet Take 1 tablet by mouth 2 (two) times daily.    [provider]  clotrimazole-betamethasone (LOTRISONE) cream Apply to affected area 2 times daily prn 03/17/20   Candis Schatz, PA-C  HYDROcodone-acetaminophen (NORCO) 5-325 MG tablet Take 1 tablet by mouth 2 (two) times daily as needed for moderate pain. 01/15/20   Verlee Monte, NP  levothyroxine (SYNTHROID, LEVOTHROID) 112 MCG tablet  04/23/18   [provider]  Multiple Vitamin (MULTIVITAMIN) tablet Take 1 tablet by mouth daily.    [provider]  predniSONE (STERAPRED UNI-PAK 21 TAB) 10 MG (21) TBPK tablet Take by mouth daily. 60  mg x 1 day, 50 mg x 1 day, 40 mg x 1 day, 30 mg x 1 day, 20 mg x 1 day, 10 mg x 1 day 01/15/20   Verlee Monte, NP    Family History Family History  Problem Relation Age of Onset  . Diabetes Mother   . Cancer Other   . Pulmonary embolism Sister   . Deep vein thrombosis Sister     Social History Social History   Tobacco Use  . Smoking status: Current Every Day Smoker    Packs/day: 1.75    Types: Cigarettes  . Smokeless tobacco: Never Used  Vaping Use  . Vaping Use: Some days  Substance Use Topics  . Alcohol use: Yes    Comment: occasionally  . Drug use: No     Allergies   Patient has no known allergies.   Review of Systems Review of Systems  Constitutional: Positive for appetite change, chills, diaphoresis and fatigue. Negative for activity change and fever.  HENT: Positive for postnasal drip. Negative for congestion, ear discharge, ear pain, rhinorrhea, sore throat and trouble swallowing.   Eyes: Negative for discharge.  Respiratory: Positive for cough, chest tightness and shortness of breath. Negative for wheezing.   Cardiovascular: Negative for chest pain.  Gastrointestinal: Positive for abdominal pain and diarrhea. Negative for nausea and vomiting.       Last night felt L mid  abd pain.  Genitourinary: Negative for difficulty urinating, dysuria, frequency and urgency.  Musculoskeletal: Positive for myalgias.  Skin: Negative for rash.  Hematological: Negative for adenopathy.   Physical Exam Triage Vital Signs ED Triage Vitals  Enc Vitals Group     BP 05/23/20 1734 96/66     Pulse Rate 05/23/20 1734 78     Resp 05/23/20 1734 16     Temp 05/23/20 1734 98.2 F (36.8 C)     Temp Source 05/23/20 1734 Oral     SpO2 05/23/20 1734 99 %     Weight 05/23/20 1730 167 lb (75.8 kg)     Height 05/23/20 1730 5\' 9"  (1.753 m)     Head Circumference --      Peak Flow --      Pain Score 05/23/20 1730 10     Pain Loc --      Pain Edu? --      Excl. in GC? --    No data  found.  Updated Vital Signs BP 96/66 (BP Location: Right Arm)   Pulse 78   Temp 98.2 F (36.8 C) (Oral)   Resp 16   Ht 5\' 9"  (1.753 m)   Wt 167 lb (75.8 kg)   SpO2 99%   BMI 24.66 kg/m   Visual Acuity Right Eye Distance:   Left Eye Distance:   Bilateral Distance:    Right Eye Near:   Left Eye Near:    Bilateral Near:      Physical Exam Vitals signs and nursing note reviewed.  Constitutional:      General: She is not in acute distress.    Appearance: Normal appearance. She is not ill-appearing, toxic-appearing or diaphoretic.  HENT:     Head: Normocephalic.     Right Ear: Tympanic membrane, ear canal and external ear normal.     Left Ear: Tympanic membrane, ear canal and external ear normal.     Nose: Nose normal.     Mouth/Throat:     Mouth: Mucous membranes are moist.  Eyes:     General: No scleral icterus.       Right eye: No discharge.        Left eye: No discharge.     Conjunctiva/sclera: Conjunctivae normal.  Neck:     Musculoskeletal: Neck supple. No neck rigidity.  Cardiovascular:     Rate and Rhythm: Normal rate and regular rhythm.     Heart sounds: No murmur.  Pulmonary:     Effort: Pulmonary effort is normal.     Breath sounds: Normal breath sounds.  Abdominal:     General: Bowel sounds are normal. There is no distension.     Palpations: Abdomen is soft. There is no mass.     Tenderness: There is no abdominal tenderness. There is no guarding or rebound.     Hernia: No hernia is present.  Musculoskeletal: Normal range of motion.  Lymphadenopathy:     Cervical: No cervical adenopathy.  Skin:    General: Skin is warm and dry.     Coloration: Skin is not jaundiced.     Findings: No rash.  Neurological:     Mental Status: She is alert and oriented to person, place, and time.     Gait: Gait normal.  Psychiatric:        Mood and Affect: Mood normal.        Behavior: Behavior normal.        Thought Content: Thought content normal.  Judgment:  Judgment normal.   UC Treatments / Results  Labs (all labs ordered are listed, but only abnormal results are displayed) Labs Reviewed  SARS CORONAVIRUS 2 (TAT 6-24 HRS)    EKG Normal sinus rhythm  Radiology No results found.  Procedures Procedures (including critical care time)  Medications Ordered in UC Medications - No data to display  Initial Impression / Assessment and Plan / UC Course  I have reviewed the triage vital signs and the nursing notes. I suspect she has Covid. I gave her Rx for Hycodan cough med for qhs Pertinent labs & imaging results that were available during my care of the patient were reviewed by me and considered in my medical decision making (see chart for details). See instructions. Final Clinical Impressions(s) / UC Diagnoses   Final diagnoses:  None   Discharge Instructions   None    ED Prescriptions    None     PDMP not reviewed this encounter.   Garey Ham, Cordelia Poche 05/23/20 2034

## 2020-05-23 NOTE — ED Triage Notes (Signed)
Patient c/o cough, chest congestion, chest pain and bodyaches that started 2 days ago.  Patient denies fevers.

## 2020-05-23 NOTE — Discharge Instructions (Addendum)
Your urine test does not show any signs of infection.  If your Covid test ends up positive you may take the following supplements to help your immune system be stronger to fight this viral infection Take Quarcetin 500 mg three times a day x 7 days with Zinc 50 mg ones a day x 7 days. The quarcetin is an antiviral and anti-inflammatory supplement which helps open the zinc channels in the cell to absorb Zinc. Zinc helps decrease the virus load in your body. Take Melatonin 6-10 mg at bed time which also helps support your immune system.  Also make sure to take Vit D 5,000 IU per day with a fatty meal and Vit C 1000 mg a day until you are completely better. Stay on Vitamin D 2,000  and C the rest of the season.  Don't lay around, keep active and walk as much as you are able to to prevent worsening of your symptoms.  Follow up with your family Dr next week.  If you get short of breath and you are able to check  your oxygen with a pulse oxygen meter, if it gets to 92% or less, you need to go to the hospital to be admitted. If you dont have one, come back here and we will assess you.

## 2020-05-24 LAB — SARS CORONAVIRUS 2 (TAT 6-24 HRS): SARS Coronavirus 2: NEGATIVE

## 2020-05-27 ENCOUNTER — Ambulatory Visit: Payer: Self-pay

## 2020-05-27 ENCOUNTER — Telehealth: Payer: Self-pay

## 2020-05-27 VITALS — BP 110/76 | HR 112 | Temp 99.0°F

## 2020-05-27 DIAGNOSIS — Z021 Encounter for pre-employment examination: Secondary | ICD-10-CM | POA: Insufficient documentation

## 2020-05-27 DIAGNOSIS — R079 Chest pain, unspecified: Secondary | ICD-10-CM

## 2020-05-27 NOTE — Progress Notes (Signed)
Got a call at 14:15 from pt's manager that she was complaining of chest pain.  Pt was crying when I arrived, I asked pt where was her pain and she pointed to her chest. Pt was slightly clammy and sitting in tripod position. I brought pt to nurses office. Vitals were taken and 911 was called. Pt was still in pain but seemed to be better after being in the air conditioning. Pt was running a low grade fever and reported BP was elevated according to her normal. First responders arrived about 14:40 and took over treatment. Pt was taken by ambulance to the ED. Pt was alert and verbal when she left.

## 2020-05-27 NOTE — Telephone Encounter (Signed)
Copied from CRM [id number]. Topic: General - Call Back - No Documentation >> May 26, 2020  3:53 PM Randol Kern wrote: Best contact: [phone]  Pt would like a call back to discuss her conditions to see if office would be more suitable for her. Please advise    Attempted to contact the patient, no answer. Phone call forwarded to voicemail. If patient calls back please notify her that we are a [facility] just like her current provider.

## 2020-06-13 ENCOUNTER — Ambulatory Visit
Admission: EM | Admit: 2020-06-13 | Discharge: 2020-06-13 | Disposition: A | Discharge status: Home / Self Care | Payer: BC Managed Care – PPO | Attending: Physician Assistant | Admitting: Physician Assistant

## 2020-06-13 ENCOUNTER — Other Ambulatory Visit: Payer: Self-pay

## 2020-06-13 ENCOUNTER — Encounter: Payer: Self-pay | Admitting: Emergency Medicine

## 2020-06-13 DIAGNOSIS — B349 Viral infection, unspecified: Secondary | ICD-10-CM | POA: Insufficient documentation

## 2020-06-13 DIAGNOSIS — E079 Disorder of thyroid, unspecified: Secondary | ICD-10-CM | POA: Insufficient documentation

## 2020-06-13 DIAGNOSIS — F17210 Nicotine dependence, cigarettes, uncomplicated: Secondary | ICD-10-CM | POA: Diagnosis not present

## 2020-06-13 DIAGNOSIS — R059 Cough, unspecified: Secondary | ICD-10-CM

## 2020-06-13 DIAGNOSIS — R112 Nausea with vomiting, unspecified: Secondary | ICD-10-CM | POA: Insufficient documentation

## 2020-06-13 DIAGNOSIS — R05 Cough: Secondary | ICD-10-CM | POA: Insufficient documentation

## 2020-06-13 DIAGNOSIS — Z79890 Hormone replacement therapy (postmenopausal): Secondary | ICD-10-CM | POA: Diagnosis not present

## 2020-06-13 DIAGNOSIS — R519 Headache, unspecified: Secondary | ICD-10-CM | POA: Diagnosis not present

## 2020-06-13 DIAGNOSIS — Z20822 Contact with and (suspected) exposure to covid-19: Secondary | ICD-10-CM | POA: Insufficient documentation

## 2020-06-13 DIAGNOSIS — Z791 Long term (current) use of non-steroidal anti-inflammatories (NSAID): Secondary | ICD-10-CM | POA: Insufficient documentation

## 2020-06-13 DIAGNOSIS — J42 Unspecified chronic bronchitis: Secondary | ICD-10-CM | POA: Insufficient documentation

## 2020-06-13 DIAGNOSIS — Z79899 Other long term (current) drug therapy: Secondary | ICD-10-CM | POA: Insufficient documentation

## 2020-06-13 LAB — SARS CORONAVIRUS 2 (TAT 6-24 HRS): SARS Coronavirus 2: NEGATIVE

## 2020-06-13 MED ORDER — ALBUTEROL SULFATE HFA 108 (90 BASE) MCG/ACT IN AERS
1.0000 | INHALATION_SPRAY | Freq: Four times a day (QID) | RESPIRATORY_TRACT | 0 refills | Status: DC | PRN
Start: 2020-06-13 — End: 2020-10-24

## 2020-06-13 MED ORDER — BENZONATATE 200 MG PO CAPS: 200.0000 mg | ORAL_CAPSULE | Freq: Three times a day (TID) | ORAL | 0 refills | Status: AC

## 2020-06-13 MED ORDER — DICLOFENAC SODIUM 75 MG PO TBEC: 75.0000 mg | DELAYED_RELEASE_TABLET | Freq: Two times a day (BID) | ORAL | 0 refills | Status: AC

## 2020-06-13 NOTE — Discharge Instructions (Addendum)

## 2020-06-13 NOTE — ED Triage Notes (Signed)
Pt c/o body aches, fatigue, cough, diarrhea, sore throat, nausea and fatigue. Started yesterday, however she feels like she has been tired and nauseated for the last couple of weeks. She had a possible covid exposure.

## 2020-06-13 NOTE — ED Provider Notes (Signed)
[facility]    CSN: [id number] Arrival date & time: 06/13/20  1324      History   Chief Complaint Chief Complaint  Patient presents with  . Generalized Body Aches    HPI Courtney [name] is a 44 y.o. female.   Patient presents for body aches, fatigue, cough, diarrhea and sore throat starting yesterday.  She also has a feeling nauseous.  Patient states she might have been exposed to Covid in the past couple of weeks.  Patient states that her husband rides with someone to work and recently tested positive for Covid.  Her husband has similar symptoms to what she has and has not been tested for Covid.  Patient denies any fever, chest pain or shortness of breath. Past medical history is significant for chronic bronchitis.  She is a current smoker.  Patient has not taken any over-the-counter cough medication.  She states she has taken Zofran for nausea and that is helped.  Patient denies any other concerns.  HPI  Past Medical History:  Diagnosis Date  . Chronic bronchitis (HCC)   . Thyroid disease     Patient Active Problem List   Diagnosis Date Noted  . Pre-employment drug screening 05/27/2020    Past Surgical History:  Procedure Laterality Date  . ABDOMINAL HYSTERECTOMY      OB History   No obstetric history on file.      Home Medications    Prior to Admission medications   Medication Sig Start Date End Date Taking? Authorizing Provider  Cholecalciferol (VITAMIN D3) 10 MCG (400 UNIT) tablet Take 400 Units by mouth daily.   Yes [provider]  clotrimazole-betamethasone (LOTRISONE) cream Apply to affected area 2 times daily prn 03/17/20  Yes Candis Schatz, PA-C  levothyroxine (SYNTHROID, LEVOTHROID) 112 MCG tablet  04/23/18  Yes [provider]  Zinc 50 MG CAPS Take by mouth.   Yes [provider]  albuterol (VENTOLIN HFA) 108 (90 Base) MCG/ACT inhaler Inhale 1-2 puffs into the lungs every 6 (six) hours as needed for wheezing or  shortness of breath. 06/13/20 07/13/20  Shirlee Latch, PA-C  APPLE CIDER VINEGAR PO Take by mouth.    [provider]  benzonatate (TESSALON) 200 MG capsule Take 1 capsule (200 mg total) by mouth every 8 (eight) hours for 10 days. 06/13/20 06/23/20  Eusebio Friendly B, PA-C  calcium citrate-vitamin D (CITRACAL+D) 315-200 MG-UNIT tablet Take 1 tablet by mouth 2 (two) times daily.    [provider]  diclofenac (VOLTAREN) 75 MG EC tablet Take 1 tablet (75 mg total) by mouth 2 (two) times daily for 15 days. 06/13/20 06/28/20  Shirlee Latch, PA-C  HYDROcodone-acetaminophen (NORCO) 5-325 MG tablet Take 1 tablet by mouth 2 (two) times daily as needed for moderate pain. 01/15/20   Verlee Monte, NP  HYDROcodone-homatropine [facility]) 5-1.5 MG/5ML syrup Take 5 mLs by mouth at bedtime. 05/23/20   Rodriguez-Southworth, Nettie Elm, PA-C  Multiple Vitamin (MULTIVITAMIN) tablet Take 1 tablet by mouth daily.    [provider]    Family History Family History  Problem Relation Age of Onset  . Diabetes Mother   . Cancer Other   . Pulmonary embolism Sister   . Deep vein thrombosis Sister     Social History Social History   Tobacco Use  . Smoking status: Current Every Day Smoker    Packs/day: 1.75    Types: Cigarettes  . Smokeless tobacco: Never Used  Vaping Use  .  Vaping Use: Some days  Substance Use Topics  . Alcohol use: Yes    Comment: occasionally  . Drug use: No     Allergies   Patient has no known allergies.   Review of Systems Review of Systems  Constitutional: Positive for fatigue. Negative for chills, diaphoresis and fever.  HENT: Positive for sore throat. Negative for congestion, ear pain, rhinorrhea, sinus pressure and sinus pain.   Respiratory: Positive for cough. Negative for shortness of breath.   Gastrointestinal: Positive for diarrhea and nausea. Negative for abdominal pain and vomiting.  Musculoskeletal: Positive for myalgias. Negative for arthralgias.    Skin: Negative for rash.  Neurological: Positive for headaches. Negative for weakness.  Hematological: Negative for adenopathy.     Physical Exam Triage Vital Signs ED Triage Vitals  Enc Vitals Group     BP 06/13/20 0927 101/75     Pulse Rate 06/13/20 0927 82     Resp 06/13/20 0927 18     Temp 06/13/20 0927 98.2 F (36.8 C)     Temp Source 06/13/20 0927 Oral     SpO2 06/13/20 0927 100 %     Weight 06/13/20 0922 167 lb 1.7 oz (75.8 kg)     Height 06/13/20 0922 5\' 9"  (1.753 m)     Head Circumference --      Peak Flow --      Pain Score 06/13/20 0922 8     Pain Loc --      Pain Edu? --      Excl. in GC? --    No data found.  Updated Vital Signs BP 101/75 (BP Location: Left Arm)   Pulse 82   Temp 98.2 F (36.8 C) (Oral)   Resp 18   Ht 5\' 9"  (1.753 m)   Wt 167 lb 1.7 oz (75.8 kg)   SpO2 100%   BMI 24.68 kg/m      Physical Exam Vitals and nursing note reviewed.  Constitutional:      General: She is not in acute distress.    Appearance: Normal appearance. She is not ill-appearing or toxic-appearing.  HENT:     Head: Normocephalic and atraumatic.     Right Ear: Tympanic membrane, ear canal and external ear normal.     Left Ear: Tympanic membrane, ear canal and external ear normal.     Nose: Congestion present.     Mouth/Throat:     Mouth: Mucous membranes are moist.     Pharynx: Oropharynx is clear. Posterior oropharyngeal erythema (clear PND) present.  Eyes:     General: No scleral icterus.       Right eye: No discharge.        Left eye: No discharge.     Conjunctiva/sclera: Conjunctivae normal.  Cardiovascular:     Rate and Rhythm: Normal rate and regular rhythm.     Heart sounds: Normal heart sounds.  Pulmonary:     Effort: Pulmonary effort is normal. No respiratory distress.     Breath sounds: Normal breath sounds. No wheezing, rhonchi or rales.  Musculoskeletal:     Cervical back: Neck supple.  Skin:    General: Skin is dry.  Neurological:      General: No focal deficit present.     Mental Status: She is alert. Mental status is at baseline.     Motor: No weakness.     Gait: Gait normal.  Psychiatric:        Mood and Affect: Mood normal.  Behavior: Behavior normal.        Thought Content: Thought content normal.      UC Treatments / Results  Labs (all labs ordered are listed, but only abnormal results are displayed) Labs Reviewed  SARS CORONAVIRUS 2 (TAT 6-24 HRS)    EKG   Radiology No results found.  Procedures Procedures (including critical care time)  Medications Ordered in UC Medications - No data to display  Initial Impression / Assessment and Plan / UC Course  I have reviewed the triage vital signs and the nursing notes.  Pertinent labs & imaging results that were available during my care of the patient were reviewed by me and considered in my medical decision making (see chart for details).   Covid test obtained.  Discussed with patient how to access results.  Advised her to isolate until results return and longer if positive.  CDC guidelines and ED precautions discussed.  Advised patient to follow-up with her PCP for a basic physical and labs.  She mentions feeling little bit more fatigued than normal.  Advised her to see her PCP and have her thyroid checked since she has hypothyroidism and does not always take her medication.  I have sent Tessalon Perles for cough.  I have also sent a refill of her albuterol inhaler.  I also sent diclofenac for headache.  Advised patient to follow-up with our clinic as needed.  Final Clinical Impressions(s) / UC Diagnoses   Final diagnoses:  Viral illness  Cough  Non-intractable vomiting with nausea, unspecified vomiting type  Chronic bronchitis, unspecified chronic bronchitis type (HCC)  Acute nonintractable headache, unspecified headache type     Discharge Instructions     URI/COLD SYMPTOMS: Your exam today is consistent with a viral illness. Antibiotics  are not indicated at this time. Use medications as directed, including cough syrup, nasal saline, and decongestants. Your symptoms should improve over the next few days and resolve within 7-10 days. Increase rest and fluids. F/u if symptoms worsen or predominate such as sore throat, ear pain, productive cough, shortness of breath, or if you develop high fevers or worsening fatigue over the next several days.    You have received COVID testing today either for positive exposure, concerning symptoms that could be related to COVID infection, screening purposes, or re-testing after confirmed positive.  Your test obtained today checks for active viral infection in the last 1-2 weeks. If your test is negative now, you can still test positive later. So, if you do develop symptoms you should either get re-tested and/or isolate x 10 days. Please follow CDC guidelines.  While Rapid antigen tests come back in 15-20 minutes, send out PCR/molecular test results typically come back within 24 hours. In the mean time, if you are symptomatic, assume this could be a positive test and treat/monitor yourself as if you do have COVID.   We will call with test results. Please download the MyChart app and set up a profile to access test results.   If symptomatic, go home and rest. Push fluids. Take Tylenol as needed for discomfort. Gargle warm salt water. Throat lozenges. Take Mucinex DM or Robitussin for cough. Humidifier in bedroom to ease coughing. Warm showers. Also review the COVID handout for more information.  COVID-19 INFECTION: The incubation period of COVID-19 is approximately 14 days after exposure, with most symptoms developing in roughly 4-5 days. Symptoms may range in severity from mild to critically severe. Roughly 80% of those infected will have mild symptoms. People of  any age may become infected with COVID-19 and have the ability to transmit the virus. The most common symptoms include: fever, fatigue, cough,  body aches, headaches, sore throat, nasal congestion, shortness of breath, nausea, vomiting, diarrhea, changes in smell and/or taste.    COURSE OF ILLNESS Some patients may begin with mild disease which can progress quickly into critical symptoms. If your symptoms are worsening please call ahead to the Emergency Department and proceed there for further treatment. Recovery time appears to be roughly 1-2 weeks for mild symptoms and 3-6 weeks for severe disease.   GO IMMEDIATELY TO ER FOR FEVER YOU ARE UNABLE TO GET DOWN WITH TYLENOL, BREATHING PROBLEMS, CHEST PAIN, FATIGUE, LETHARGY, INABILITY TO EAT OR DRINK, ETC  QUARANTINE AND ISOLATION: To help decrease the spread of COVID-19 please remain isolated if you have COVID infection or are highly suspected to have COVID infection. This means -stay home and isolate to one room in the home if you live with others. Do not share a bed or bathroom with others while ill, sanitize and wipe down all countertops and keep common areas clean and disinfected. You may discontinue isolation if you have a mild case and are asymptomatic 10 days after symptom onset as long as you have been fever free >24 hours without having to take Motrin or Tylenol. If your case is more severe (meaning you develop pneumonia or are admitted in the hospital), you may have to isolate longer.   If you have been in close contact (within 6 feet) of someone diagnosed with COVID 19, you are advised to quarantine in your home for 14 days as symptoms can develop anywhere from 2-14 days after exposure to the virus. If you develop symptoms, you  must isolate.  Most current guidelines for COVID after exposure -isolate 10 days if you ARE NOT tested for COVID as long as symptoms do not develop -isolate 7 days if you are tested and remain asymptomatic -You do not necessarily need to be tested for COVID if you have + exposure and        develop   symptoms. Just isolate at home x10 days from symptom  onset During this global pandemic, CDC advises to practice social distancing, try to stay at least 56ft away from others at all times. Wear a face covering. Wash and sanitize your hands regularly and avoid going anywhere that is not necessary.  KEEP IN MIND THAT THE COVID TEST IS NOT 100% ACCURATE AND YOU SHOULD STILL DO EVERYTHING TO PREVENT POTENTIAL SPREAD OF VIRUS TO OTHERS (WEAR MASK, WEAR GLOVES, WASH HANDS AND SANITIZE REGULARLY). IF INITIAL TEST IS NEGATIVE, THIS MAY NOT MEAN YOU ARE DEFINITELY NEGATIVE. MOST ACCURATE TESTING IS DONE 5-7 DAYS AFTER EXPOSURE.   It is not advised by CDC to get re-tested after receiving a positive COVID test since you can still test positive for weeks to months after you have already cleared the virus.   *If you have not been vaccinated for COVID, I strongly suggest you consider getting vaccinated as long as there are no contraindications.      ED Prescriptions    Medication Sig Dispense Auth. Provider   albuterol (VENTOLIN HFA) 108 (90 Base) MCG/ACT inhaler Inhale 1-2 puffs into the lungs every 6 (six) hours as needed for wheezing or shortness of breath. 1 g Eusebio Friendly B, PA-C   benzonatate (TESSALON) 200 MG capsule Take 1 capsule (200 mg total) by mouth every 8 (eight) hours for 10 days. 30 capsule  Lavinia Mcneely B, PA-C   diclofenac (VOLTAREN) 75 MG EC tablet Take 1 tablet (75 mg total) by mouth 2 (two) times daily for 15 days. 30 tablet Gareth Morgan     PDMP not reviewed this encounter.   Shirlee Latch, PA-C 06/13/20 [phone] [id number]

## 2020-07-24 ENCOUNTER — Ambulatory Visit (INDEPENDENT_AMBULATORY_CARE_PROVIDER_SITE_OTHER): Payer: BC Managed Care – PPO

## 2020-07-24 ENCOUNTER — Other Ambulatory Visit: Payer: Self-pay

## 2020-07-24 ENCOUNTER — Ambulatory Visit
Admission: EM | Admit: 2020-07-24 | Discharge: 2020-07-24 | Disposition: A | Discharge status: Home / Self Care | Payer: BC Managed Care – PPO | Attending: Emergency Medicine | Admitting: Emergency Medicine

## 2020-07-24 ENCOUNTER — Encounter: Payer: Self-pay | Admitting: Emergency Medicine

## 2020-07-24 DIAGNOSIS — R0789 Other chest pain: Secondary | ICD-10-CM | POA: Diagnosis not present

## 2020-07-24 DIAGNOSIS — S29011A Strain of muscle and tendon of front wall of thorax, initial encounter: Secondary | ICD-10-CM | POA: Diagnosis present

## 2020-07-24 LAB — URINALYSIS, COMPLETE (UACMP) WITH MICROSCOPIC
Bilirubin Urine: NEGATIVE
Glucose, UA: NEGATIVE mg/dL
Hgb urine dipstick: NEGATIVE
Ketones, ur: NEGATIVE mg/dL
Nitrite: NEGATIVE
Protein, ur: NEGATIVE mg/dL
RBC / HPF: NONE SEEN RBC/hpf (ref 0–5)
Specific Gravity, Urine: 1.02 (ref 1.005–1.030)
pH: 8.5 — ABNORMAL HIGH (ref 5.0–8.0)

## 2020-07-24 MED ORDER — TIZANIDINE HCL 4 MG PO TABS: 4.0000 mg | ORAL_TABLET | Freq: Four times a day (QID) | ORAL | 0 refills | Status: DC | PRN

## 2020-07-24 MED ORDER — HYDROCODONE-ACETAMINOPHEN 5-325 MG PO TABS: 2.0000 | ORAL_TABLET | ORAL | 0 refills | Status: DC | PRN

## 2020-07-24 NOTE — ED Provider Notes (Signed)
[facility]    CSN: [id number] Arrival date & time: 07/24/20  1322      History   Chief Complaint Chief Complaint  Patient presents with  . Muscle Pain    left side    HPI Courtney [name] is a 44 y.o. female.   45 year old female here for evaluation of left flank pain.  Patient reports that her pain started last night was initially under her left breast but then moved more into her side and flank. Patient admits to having a cough but no more than her typical smoker's cough. She denies shortness of breath. Patient states that she is nauseous from the pain and took a Percocet this morning which took the edge off her pain. Patient denies urinary complaints or blood in her urine. Patient is concerned that she may have strained herself at her job. 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 No trauma.     Past Medical History:  Diagnosis Date  . Chronic bronchitis (HCC)   . Thyroid disease     Patient Active Problem List   Diagnosis Date Noted  . Pre-employment drug screening 05/27/2020    Past Surgical History:  Procedure Laterality Date  . ABDOMINAL HYSTERECTOMY      OB History   No obstetric history on file.      Home Medications    Prior to Admission medications   Medication Sig Start Date End Date Taking? Authorizing Provider  albuterol (VENTOLIN HFA) 108 (90 Base) MCG/ACT inhaler Inhale 1-2 puffs into the lungs every 6 (six) hours as needed for wheezing or shortness of breath. 06/13/20 07/24/20 Yes Shirlee Latch, PA-C  HYDROcodone-acetaminophen (NORCO) 5-325 MG tablet Take 1 tablet by mouth 2 (two) times daily as needed for moderate pain. 01/15/20  Yes Verlee Monte, NP  levothyroxine (SYNTHROID, LEVOTHROID) 112 MCG tablet  04/23/18  Yes [provider]  APPLE CIDER VINEGAR PO Take by mouth.    [provider]  calcium citrate-vitamin D (CITRACAL+D) 315-200 MG-UNIT tablet Take 1  tablet by mouth 2 (two) times daily.    [provider]  Cholecalciferol (VITAMIN D3) 10 MCG (400 UNIT) tablet Take 400 Units by mouth daily.    [provider]  clotrimazole-betamethasone (LOTRISONE) cream Apply to affected area 2 times daily prn 03/17/20   Candis Schatz, PA-C  HYDROcodone-homatropine [facility]) 5-1.5 MG/5ML syrup Take 5 mLs by mouth at bedtime. 05/23/20   Rodriguez-Southworth, Nettie Elm, PA-C  Multiple Vitamin (MULTIVITAMIN) tablet Take 1 tablet by mouth daily.    [provider]  Zinc 50 MG CAPS Take by mouth.    [provider]    Family History Family History  Problem Relation Age of Onset  . Diabetes Mother   . Cancer Other   . Pulmonary embolism Sister   . Deep vein thrombosis Sister     Social History Social History   Tobacco Use  . Smoking status: Current Every Day Smoker    Packs/day: 1.75    Types: Cigarettes  . Smokeless tobacco: Never Used  Vaping Use  . Vaping Use: Some days  Substance Use Topics  . Alcohol use: Yes    Comment: occasionally  . Drug use: No     Allergies   Patient has no known allergies.   Review of Systems Review of Systems  Constitutional: Negative for activity change and fever.  HENT: Negative for congestion and rhinorrhea.   Respiratory: Positive for  cough. Negative for shortness of breath and wheezing.   Cardiovascular: Negative for chest pain and palpitations.  Gastrointestinal: Positive for nausea. Negative for diarrhea and vomiting.  Genitourinary: Positive for flank pain. Negative for dysuria, frequency, hematuria and urgency.  Musculoskeletal: Positive for back pain. Negative for arthralgias and myalgias.  Skin: Negative for rash.  Neurological: Negative for syncope.  Hematological: Negative.   Psychiatric/Behavioral: Negative.      Physical Exam Triage Vital Signs ED Triage Vitals  Enc Vitals Group     BP 07/24/20 1348 107/78     Pulse Rate 07/24/20 1348 68     Resp  07/24/20 1348 18     Temp 07/24/20 1348 98 F (36.7 C)     Temp src --      SpO2 07/24/20 1348 100 %     Weight 07/24/20 1346 160 lb (72.6 kg)     Height 07/24/20 1346 5\' 8"  (1.727 m)     Head Circumference --      Peak Flow --      Pain Score 07/24/20 1345 10     Pain Loc --      Pain Edu? --      Excl. in GC? --    No data found.  Updated Vital Signs BP 107/78 (BP Location: Right Arm)   Pulse 68   Temp 98 F (36.7 C)   Resp 18   Ht 5\' 8"  (1.727 m)   Wt 160 lb (72.6 kg)   SpO2 100%   BMI 24.33 kg/m   Visual Acuity Right Eye Distance:   Left Eye Distance:   Bilateral Distance:    Right Eye Near:   Left Eye Near:    Bilateral Near:     Physical Exam Vitals and nursing note reviewed.  Constitutional:      General: She is not in acute distress.    Appearance: Normal appearance. She is normal weight. She is not toxic-appearing.  HENT:     Head: Normocephalic and atraumatic.  Eyes:     General: No scleral icterus.    Extraocular Movements: Extraocular movements intact.     Conjunctiva/sclera: Conjunctivae normal.     Pupils: Pupils are equal, round, and reactive to light.  Cardiovascular:     Rate and Rhythm: Normal rate and regular rhythm.     Pulses: Normal pulses.     Heart sounds: Normal heart sounds. No murmur heard.  No gallop.   Pulmonary:     Effort: Pulmonary effort is normal.     Breath sounds: Normal breath sounds. No wheezing, rhonchi or rales.  Abdominal:     Tenderness: There is left CVA tenderness.  Musculoskeletal:        General: Tenderness present. No signs of injury. Normal range of motion.     Cervical back: Normal range of motion and neck supple.     Comments: Patient has tenderness to her ninth and 10th rib on the left side just posterior to mid axillary line. No crepitus appreciated on exam. No swelling appreciated on exam. Patient does have left CVA tenderness but it is hard to distinguish if this is coming from musculoskeletal or  genitourinary focus.  Skin:    General: Skin is warm and dry.     Capillary Refill: Capillary refill takes less than 2 seconds.     Findings: No erythema or rash.  Neurological:     General: No focal deficit present.     Mental Status: She is alert and  oriented to person, place, and time.  Psychiatric:        Mood and Affect: Mood normal.        Behavior: Behavior normal.        Thought Content: Thought content normal.        Judgment: Judgment normal.      UC Treatments / Results  Labs (all labs ordered are listed, but only abnormal results are displayed) Labs Reviewed  URINALYSIS, COMPLETE (UACMP) WITH MICROSCOPIC    EKG   Radiology No results found.  Procedures Procedures (including critical care time)  Medications Ordered in UC Medications - No data to display  Initial Impression / Assessment and Plan / UC Course  I have reviewed the triage vital signs and the nursing notes.  Pertinent labs & imaging results that were available during my care of the patient were reviewed by me and considered in my medical decision making (see chart for details).   Patient here for evaluation of left flank/left side pain that started last night. No known injury. No hematuria or urinary complaints. Patient has a typical smoker's cough but nothing increased or new. Patient does a physical job for living and thinks she may have strained her self. She has used Percocet at home with mild relief of her pain. Patient has CVA and left flank tenderness on exam. Will obtain chest x-ray and also UA to evaluate pain.  UA shows leukocytes and few bacteria.  Will send for culture to fully evaluate.  No blood.  X-ray negative for pneumothorax or pneumonia.  Will DC home with diagnosis of chest wall strain and treat with tizanidine for spasm and over-the-counter ibuprofen.  Will give a few tablets of hydrocodone for severe pain.  Will instruct patient to follow-up with her primary care doctor if  symptoms continue.   Final Clinical Impressions(s) / UC Diagnoses   Final diagnoses:  None   Discharge Instructions   None    ED Prescriptions    None     PDMP not reviewed this encounter.   Becky Augusta, NP 07/24/20 [phone]

## 2020-07-24 NOTE — ED Triage Notes (Signed)
Patient c/o left side pain that started last night. Patient denies injury. She states it may be from the work she does at her job.

## 2020-07-24 NOTE — Discharge Instructions (Addendum)
No signs of pneumonia or kidney stone under x-ray or urine.  Use over-the-counter ibuprofen, 600 mg every 6 hour with food, as needed for pain.  Take the tizanidine muscle relaxer to help with spasm every 6 hours.  Use the hydrocodone as needed for severe pain.  If your symptoms continue to follow-up with your primary care doctor.

## 2020-07-26 LAB — URINE CULTURE
Culture: <10000 — AB
Special Requests: NORMAL

## 2020-10-24 ENCOUNTER — Ambulatory Visit
Admission: EM | Admit: 2020-10-24 | Discharge: 2020-10-24 | Disposition: A | Discharge status: Home / Self Care | Payer: BC Managed Care – PPO | Attending: Family Medicine | Admitting: Family Medicine

## 2020-10-24 ENCOUNTER — Other Ambulatory Visit: Payer: Self-pay

## 2020-10-24 DIAGNOSIS — R0981 Nasal congestion: Secondary | ICD-10-CM | POA: Insufficient documentation

## 2020-10-24 DIAGNOSIS — F32A Depression, unspecified: Secondary | ICD-10-CM | POA: Diagnosis not present

## 2020-10-24 DIAGNOSIS — J988 Other specified respiratory disorders: Secondary | ICD-10-CM | POA: Insufficient documentation

## 2020-10-24 DIAGNOSIS — R0789 Other chest pain: Secondary | ICD-10-CM | POA: Insufficient documentation

## 2020-10-24 DIAGNOSIS — Z79899 Other long term (current) drug therapy: Secondary | ICD-10-CM | POA: Insufficient documentation

## 2020-10-24 DIAGNOSIS — F419 Anxiety disorder, unspecified: Secondary | ICD-10-CM | POA: Diagnosis not present

## 2020-10-24 DIAGNOSIS — U071 COVID-19: Secondary | ICD-10-CM | POA: Insufficient documentation

## 2020-10-24 DIAGNOSIS — F17210 Nicotine dependence, cigarettes, uncomplicated: Secondary | ICD-10-CM | POA: Insufficient documentation

## 2020-10-24 MED ORDER — CLONAZEPAM 0.5 MG PO TABS: 0.5000 mg | ORAL_TABLET | Freq: Two times a day (BID) | ORAL | 0 refills | Status: DC | PRN

## 2020-10-24 MED ORDER — PREDNISONE 50 MG PO TABS: ORAL_TABLET | ORAL | 0 refills | Status: DC

## 2020-10-24 MED ORDER — VENLAFAXINE HCL ER 75 MG PO CP24: 75.0000 mg | ORAL_CAPSULE | Freq: Every day | ORAL | 1 refills | Status: DC

## 2020-10-24 NOTE — Discharge Instructions (Signed)
Medication as prescribed.  Check my chart for COVID test results.  Best of luck  Dr. Adriana Simas

## 2020-10-24 NOTE — ED Provider Notes (Signed)
[facility]    CSN: [id number] Arrival date & time: 10/24/20  1156      History   Chief Complaint Chief Complaint  Patient presents with  . Nasal Congestion  . Anxiety   HPI  45 year old Courtney [name] presents with the above complaints.  Patient reports a 3-day history of congestion, body aches, headache.  She is also had some chest tightness.  Son recently diagnosed with Covid.  She has had coworkers with Covid as well.  Desires testing today.  She is a current smoker.  Additionally, patient experiencing ongoing depression, anxiety/panic.  She has an autistic son and autistic relative that she cares for.  Patient has a lot on her plate and is taking care of everyone else and not taking care of herself.  She does not really have anyone to talk or vent to.  She attempted to see her PCP for this but she states that "they will help me".  Patient states that she has been on numerous antidepressants in the past.  Most recently she was on Effexor and states that she was doing fairly well.  Patient has had suicidal thoughts in the past but none currently.  No plan in place.  Her symptoms have been worsening as of late.    Past Medical History:  Diagnosis Date  . Chronic bronchitis (HCC)   . Thyroid disease     Patient Active Problem List   Diagnosis Date Noted  . Pre-employment drug screening 05/27/2020    Past Surgical History:  Procedure Laterality Date  . ABDOMINAL HYSTERECTOMY    . THYROIDECTOMY      OB History   No obstetric history on file.      Home Medications    Prior to Admission medications   Medication Sig Start Date End Date Taking? Authorizing Provider  clonazePAM (KLONOPIN) 0.5 MG tablet Take 1 tablet (0.5 mg total) by mouth 2 (two) times daily as needed for anxiety. 10/24/20  Yes Tommie Sams, DO  levothyroxine (SYNTHROID, LEVOTHROID) 112 MCG tablet  04/23/18  Yes [provider]  predniSONE (DELTASONE) 50 MG tablet 1 tablet daily x 5 days  10/24/20  Yes Maven Varelas G, DO  venlafaxine XR (EFFEXOR XR) 75 MG 24 hr capsule Take 1 capsule (75 mg total) by mouth daily with breakfast. 10/24/20  Yes Ormand Senn G, DO  albuterol (VENTOLIN HFA) 108 (90 Base) MCG/ACT inhaler Inhale 1-2 puffs into the lungs every 6 (six) hours as needed for wheezing or shortness of breath. 06/13/20 10/24/20  Eusebio Friendly B, PA-C  calcium citrate-vitamin D (CITRACAL+D) 315-200 MG-UNIT tablet Take 1 tablet by mouth 2 (two) times daily.  07/24/20  [provider]    Family History Family History  Problem Relation Age of Onset  . Diabetes Mother   . Cancer Other   . Pulmonary embolism Sister   . Deep vein thrombosis Sister     Social History Social History   Tobacco Use  . Smoking status: Current Every Day Smoker    Packs/day: 1.75    Types: Cigarettes  . Smokeless tobacco: Never Used  Vaping Use  . Vaping Use: Some days  Substance Use Topics  . Alcohol use: Yes    Comment: occasionally  . Drug use: No     Allergies   Patient has no known allergies.   Review of Systems Review of Systems Per HPI  Physical Exam Triage Vital Signs ED Triage Vitals  Enc Vitals Group     BP  10/24/20 1224 100/81     Pulse Rate 10/24/20 1224 97     Resp 10/24/20 1224 18     Temp 10/24/20 1224 98.4 F (36.9 C)     Temp Source 10/24/20 1224 Oral     SpO2 10/24/20 1224 99 %     Weight 10/24/20 1220 156 lb (Courtney.8 kg)     Height 10/24/20 1220 5\' 8"  (1.727 m)     Head Circumference --      Peak Flow --      Pain Score 10/24/20 1220 4     Pain Loc --      Pain Edu? --      Excl. in GC? --    Updated Vital Signs BP 100/81 (BP Location: Left Arm)   Pulse 97   Temp 98.4 F (36.9 C) (Oral)   Resp 18   Ht 5\' 8"  (1.727 m)   Wt Courtney.8 kg   SpO2 99%   BMI 23.72 kg/m   Visual Acuity Right Eye Distance:   Left Eye Distance:   Bilateral Distance:    Right Eye Near:   Left Eye Near:    Bilateral Near:     Physical Exam Vitals and nursing note  reviewed.  Constitutional:      Comments: Patient tearful and crying but in no acute distress.  HENT:     Head: Normocephalic and atraumatic.  Eyes:     General:        Right eye: No discharge.        Left eye: No discharge.     Conjunctiva/sclera: Conjunctivae normal.  Cardiovascular:     Rate and Rhythm: Normal rate and regular rhythm.  Pulmonary:     Effort: Pulmonary effort is normal.     Breath sounds: Normal breath sounds. No wheezing, rhonchi or rales.  Neurological:     Mental Status: She is alert.  Psychiatric:     Comments: Depressed mood.  Crying during history.     UC Treatments / Results  Labs (all labs ordered are listed, but only abnormal results are displayed) Labs Reviewed  SARS CORONAVIRUS 2 (TAT 6-24 HRS)    EKG   Radiology No results found.  Procedures Procedures (including critical care time)  Medications Ordered in UC Medications - No data to display  Initial Impression / Assessment and Plan / UC Course  I have reviewed the triage vital signs and the nursing notes.  Pertinent labs & imaging results that were available during my care of the patient were reviewed by me and considered in my medical decision making (see chart for details).    45 year old Courtney [name] presents with respiratory infection.  Possible COVID-19.  Awaiting test results.  Placing on prednisone.  Additionally, patient having uncontrolled anxiety and depression.  Patient previously did well on Effexor.  Placing back on Effexor 75 mg daily.  Klonopin twice daily as needed for panic attack.  Advised follow-up with local primary care physician.  Supportive care.  Final Clinical Impressions(s) / UC Diagnoses   Final diagnoses:  Respiratory infection  Anxiety and depression     Discharge Instructions     Medication as prescribed.  Check my chart for COVID test results.  Best of luck  Dr.     ED Prescriptions    Medication Sig Dispense Auth. Provider    venlafaxine XR (EFFEXOR XR) 75 MG 24 hr capsule Take 1 capsule (75 mg total) by mouth daily with breakfast. 90 capsule Warden, [place],  DO   clonazePAM (KLONOPIN) 0.5 MG tablet Take 1 tablet (0.5 mg total) by mouth 2 (two) times daily as needed for anxiety. 30 tablet Joanny Dupree G, DO   predniSONE (DELTASONE) 50 MG tablet 1 tablet daily x 5 days 5 tablet Buckland, [place] G, DO     I have reviewed the PDMP during this encounter.   Tommie Sams, [place] 10/24/20 [phone]

## 2020-10-24 NOTE — ED Triage Notes (Signed)
Patient complains of nasal congestion, body aches, headache x 3 days. States that her son was positive for covid 2 weeks ago. States that works with a bunch of people who are also out with Covid.   Patient states that she has also been having depression, anxiety and panic attacks. Patient states that she cannot establish with a new primary until march, currently on no medications.

## 2020-10-25 LAB — SARS CORONAVIRUS 2 (TAT 6-24 HRS): SARS Coronavirus 2: POSITIVE — AB

## 2021-02-19 ENCOUNTER — Encounter: Payer: Self-pay | Admitting: Emergency Medicine

## 2021-02-19 ENCOUNTER — Other Ambulatory Visit: Payer: Self-pay

## 2021-02-19 ENCOUNTER — Ambulatory Visit
Admission: EM | Admit: 2021-02-19 | Discharge: 2021-02-19 | Disposition: A | Discharge status: Home / Self Care | Payer: BC Managed Care – PPO | Attending: Family Medicine | Admitting: Family Medicine

## 2021-02-19 DIAGNOSIS — J441 Chronic obstructive pulmonary disease with (acute) exacerbation: Secondary | ICD-10-CM | POA: Diagnosis not present

## 2021-02-19 DIAGNOSIS — G8929 Other chronic pain: Secondary | ICD-10-CM | POA: Insufficient documentation

## 2021-02-19 DIAGNOSIS — R051 Acute cough: Secondary | ICD-10-CM | POA: Diagnosis present

## 2021-02-19 DIAGNOSIS — F17210 Nicotine dependence, cigarettes, uncomplicated: Secondary | ICD-10-CM | POA: Insufficient documentation

## 2021-02-19 DIAGNOSIS — Z79890 Hormone replacement therapy (postmenopausal): Secondary | ICD-10-CM | POA: Insufficient documentation

## 2021-02-19 DIAGNOSIS — Z20822 Contact with and (suspected) exposure to covid-19: Secondary | ICD-10-CM | POA: Diagnosis not present

## 2021-02-19 DIAGNOSIS — Z79899 Other long term (current) drug therapy: Secondary | ICD-10-CM | POA: Diagnosis not present

## 2021-02-19 LAB — RESP PANEL BY RT-PCR (FLU A&B, COVID) ARPGX2
Influenza A by PCR: NEGATIVE
Influenza B by PCR: NEGATIVE
SARS Coronavirus 2 by RT PCR: NEGATIVE

## 2021-02-19 MED ORDER — PREDNISONE 50 MG PO TABS: ORAL_TABLET | ORAL | 0 refills | Status: DC

## 2021-02-19 MED ORDER — DOXYCYCLINE HYCLATE 100 MG PO CAPS: 100.0000 mg | ORAL_CAPSULE | Freq: Two times a day (BID) | ORAL | 0 refills | Status: DC

## 2021-02-19 NOTE — Discharge Instructions (Signed)
I will call with the results as soon as I have them. We will discuss treatment at that time.  Take care  Dr. Adriana Simas

## 2021-02-19 NOTE — ED Triage Notes (Signed)
Pt c/o runny nose, cough, fever (101.2), body aches. Started about 3 days ago.

## 2021-02-19 NOTE — ED Provider Notes (Signed)
[facility]    CSN: [id number] Arrival date & time: 02/19/21  1242      History   Chief Complaint Chief Complaint  Patient presents with  . Generalized Body Aches  . Fever   HPI  45 year old female presents with the above complaints.  Patient reports that she has been sick for the past 3 days.  She reports runny nose, cough, headache, body aches, fever.  She has known chronic bronchitis.  Reported sick contacts.  No relieving factors.  Patient states that she is experiencing a great deal of pain.  She states that she deals with chronic pain but it has been worse since getting sick.  No relieving factors.  Patient also notes that she is anxious.  Fever has been as high as 101.2.  Currently afebrile.  No other complaints.  Past Medical History:  Diagnosis Date  . Chronic bronchitis (HCC)   . Thyroid disease     Patient Active Problem List   Diagnosis Date Noted  . Pre-employment drug screening 05/27/2020    Past Surgical History:  Procedure Laterality Date  . ABDOMINAL HYSTERECTOMY    . THYROIDECTOMY      OB History   No obstetric history on file.      Home Medications    Prior to Admission medications   Medication Sig Start Date End Date Taking? Authorizing Provider  doxycycline (VIBRAMYCIN) 100 MG capsule Take 1 capsule (100 mg total) by mouth 2 (two) times daily. 02/19/21  Yes Tommie Sams, DO  levothyroxine (SYNTHROID, LEVOTHROID) 112 MCG tablet  04/23/18  Yes [provider]  predniSONE (DELTASONE) 50 MG tablet 1 tablet daily x 5 days 02/19/21  Yes Nyanna Heideman G, DO  venlafaxine XR (EFFEXOR XR) 75 MG 24 hr capsule Take 1 capsule (75 mg total) by mouth daily with breakfast. 10/24/20  Yes Leahmarie Gasiorowski G, DO  clonazePAM (KLONOPIN) 0.5 MG tablet Take 1 tablet (0.5 mg total) by mouth 2 (two) times daily as needed for anxiety. 10/24/20   Tommie Sams, DO  albuterol (VENTOLIN HFA) 108 (90 Base) MCG/ACT inhaler Inhale 1-2 puffs into the lungs every 6  (six) hours as needed for wheezing or shortness of breath. 06/13/20 10/24/20  Eusebio Friendly B, PA-C  calcium citrate-vitamin D (CITRACAL+D) 315-200 MG-UNIT tablet Take 1 tablet by mouth 2 (two) times daily.  07/24/20  [provider]    Family History Family History  Problem Relation Age of Onset  . Diabetes Mother   . Cancer Other   . Pulmonary embolism Sister   . Deep vein thrombosis Sister     Social History Social History   Tobacco Use  . Smoking status: Current Every Day Smoker    Packs/day: 1.75    Types: Cigarettes  . Smokeless tobacco: Never Used  Vaping Use  . Vaping Use: Some days  Substance Use Topics  . Alcohol use: Yes    Comment: occasionally  . Drug use: No     Allergies   Patient has no known allergies.   Review of Systems Review of Systems Per HPI  Physical Exam Triage Vital Signs ED Triage Vitals  Enc Vitals Group     BP 02/19/21 1321 119/73     Pulse Rate 02/19/21 1321 100     Resp 02/19/21 1321 18     Temp 02/19/21 1321 98.4 F (36.9 C)     Temp Source 02/19/21 1321 Oral     SpO2 02/19/21 1321 99 %  Weight 02/19/21 1318 156 lb 1.4 oz (70.8 kg)     Height 02/19/21 1318 5\' 8"  (1.727 m)     Head Circumference --      Peak Flow --      Pain Score 02/19/21 1317 10     Pain Loc --      Pain Edu? --      Excl. in GC? --    Updated Vital Signs BP 119/73 (BP Location: Left Arm)   Pulse 100   Temp 98.4 F (36.9 C) (Oral)   Resp 18   Ht 5\' 8"  (1.727 m)   Wt 70.8 kg   SpO2 99%   BMI 23.73 kg/m   Visual Acuity Right Eye Distance:   Left Eye Distance:   Bilateral Distance:    Right Eye Near:   Left Eye Near:    Bilateral Near:     Physical Exam Vitals and nursing note reviewed.  Constitutional:      General: She is not in acute distress. HENT:     Head: Normocephalic and atraumatic.     Right Ear: Tympanic membrane normal.     Left Ear: Tympanic membrane normal.  Eyes:     General:        Right eye: No discharge.         Left eye: No discharge.     Conjunctiva/sclera: Conjunctivae normal.  Cardiovascular:     Rate and Rhythm: Regular rhythm. Tachycardia present.  Pulmonary:     Effort: Pulmonary effort is normal.     Breath sounds: Normal breath sounds. No wheezing, rhonchi or rales.  Neurological:     Mental Status: She is alert.  Psychiatric:        Mood and Affect: Mood normal.        Behavior: Behavior normal.    UC Treatments / Results  Labs (all labs ordered are listed, but only abnormal results are displayed) Labs Reviewed  RESP PANEL BY RT-PCR (FLU A&B, COVID) [id number]    EKG   Radiology No results found.  Procedures Procedures (including critical care time)  Medications Ordered in UC Medications - No data to display  Initial Impression / Assessment and Plan / UC Course  I have reviewed the triage vital signs and the nursing notes.  Pertinent labs & imaging results that were available during my care of the patient were reviewed by me and considered in my medical decision making (see chart for details).    45 year old female with known chronic bronchitis presents with respiratory symptoms.  Flu and COVID testing negative.  I suspect the patient is experiencing an exacerbation however of her chronic bronchitis, i.e. COPD exacerbation.  Treating with doxycycline and prednisone.  Final Clinical Impressions(s) / UC Diagnoses   Final diagnoses:  COPD exacerbation [facility])     Discharge Instructions     I will call with the results as soon as I have them. We will discuss treatment at that time.  Take care  Dr. 45    ED Prescriptions    Medication Sig Dispense Auth. Provider   doxycycline (VIBRAMYCIN) 100 MG capsule Take 1 capsule (100 mg total) by mouth 2 (two) times daily. 14 capsule Malike Foglio G, DO   predniSONE (DELTASONE) 50 MG tablet 1 tablet daily x 5 days 5 tablet Adriana Simas G, DO     PDMP not reviewed this encounter.   11-01-2003, Everlene Other 02/19/21  1503

## 2021-05-04 ENCOUNTER — Other Ambulatory Visit: Payer: Self-pay | Admitting: Family Medicine

## 2021-05-04 ENCOUNTER — Other Ambulatory Visit: Payer: Self-pay

## 2021-05-04 ENCOUNTER — Ambulatory Visit
Admission: RE | Admit: 2021-05-04 | Discharge: 2021-05-04 | Disposition: A | Discharge status: Home / Self Care | Payer: BC Managed Care – PPO | Source: Ambulatory Visit | Attending: Family Medicine | Admitting: Family Medicine

## 2021-05-04 DIAGNOSIS — R600 Localized edema: Secondary | ICD-10-CM | POA: Insufficient documentation

## 2021-07-22 ENCOUNTER — Other Ambulatory Visit: Payer: Self-pay | Admitting: Family Medicine

## 2021-07-22 DIAGNOSIS — Z139 Encounter for screening, unspecified: Secondary | ICD-10-CM

## 2021-07-24 ENCOUNTER — Ambulatory Visit
Admission: RE | Admit: 2021-07-24 | Discharge: 2021-07-24 | Disposition: A | Discharge status: Home / Self Care | Payer: BC Managed Care – PPO | Source: Ambulatory Visit | Attending: Family Medicine | Admitting: Family Medicine

## 2021-07-24 ENCOUNTER — Other Ambulatory Visit: Payer: Self-pay

## 2021-07-24 DIAGNOSIS — Z139 Encounter for screening, unspecified: Secondary | ICD-10-CM

## 2022-01-09 ENCOUNTER — Ambulatory Visit (INDEPENDENT_AMBULATORY_CARE_PROVIDER_SITE_OTHER): Payer: BC Managed Care – PPO

## 2022-01-09 ENCOUNTER — Other Ambulatory Visit: Payer: Self-pay

## 2022-01-09 ENCOUNTER — Ambulatory Visit
Admission: EM | Admit: 2022-01-09 | Discharge: 2022-01-09 | Disposition: A | Discharge status: Home / Self Care | Payer: BC Managed Care – PPO | Attending: Internal Medicine | Admitting: Internal Medicine

## 2022-01-09 DIAGNOSIS — R059 Cough, unspecified: Secondary | ICD-10-CM

## 2022-01-09 DIAGNOSIS — M549 Dorsalgia, unspecified: Secondary | ICD-10-CM | POA: Diagnosis not present

## 2022-01-09 DIAGNOSIS — J209 Acute bronchitis, unspecified: Secondary | ICD-10-CM | POA: Diagnosis not present

## 2022-01-09 MED ORDER — ALBUTEROL SULFATE HFA 108 (90 BASE) MCG/ACT IN AERS: 2.0000 | INHALATION_SPRAY | RESPIRATORY_TRACT | 0 refills | Status: DC | PRN

## 2022-01-09 MED ORDER — DOXYCYCLINE HYCLATE 100 MG PO CAPS: 100.0000 mg | ORAL_CAPSULE | Freq: Two times a day (BID) | ORAL | 0 refills | Status: DC

## 2022-01-09 MED ORDER — IPRATROPIUM-ALBUTEROL 0.5-2.5 (3) MG/3ML IN SOLN
3.0000 mL | Freq: Once | RESPIRATORY_TRACT | Status: AC
  Administered 2022-01-09: 3 mL via RESPIRATORY_TRACT

## 2022-01-09 NOTE — ED Provider Notes (Signed)
?[facility] ? ? ? ?CSN: [id number] ?Arrival date & time: 01/09/22  1035 ? ? ?  ? ?History   ?Chief Complaint ?Chief Complaint  ?Patient presents with  ? Cough  ? Wheezing  ? ? ?HPI ?Courtney [name] is a 46 y.o. female who presents with L upper back pain and cough 2 weeks ago, and three days ago has moved to R upper back. Has been wheezing and felt she was feverish, feeling hot and cold last week with some sweating. She is a heavy smoker and lives with a smoker. Her hustand is in the hospital with LLL pneumonia.  ? ? ? ?Past Medical History:  ?Diagnosis Date  ? Chronic bronchitis (HCC)   ? Thyroid disease   ? ? ?Patient Active Problem List  ? Diagnosis Date Noted  ? Pre-employment drug screening 05/27/2020  ? ? ?Past Surgical History:  ?Procedure Laterality Date  ? ABDOMINAL HYSTERECTOMY    ? THYROIDECTOMY    ? ? ?OB History   ?No obstetric history on file. ?  ? ? ? ?Home Medications   ? ?Prior to Admission medications   ?Medication Sig Start Date End Date Taking? Authorizing Provider  ?albuterol (VENTOLIN HFA) 108 (90 Base) MCG/ACT inhaler Inhale 2 puffs into the lungs every 4 (four) hours as needed for wheezing or shortness of breath. 01/09/22  Yes Rodriguez-Southworth, Nettie Elm, PA-C  ?doxycycline (VIBRAMYCIN) 100 MG capsule Take 1 capsule (100 mg total) by mouth 2 (two) times daily. 01/09/22  Yes Rodriguez-Southworth, Nettie Elm, PA-C  ?levothyroxine (SYNTHROID, LEVOTHROID) 112 MCG tablet  04/23/18  Yes [provider]  ?calcium citrate-vitamin D (CITRACAL+D) 315-200 MG-UNIT tablet Take 1 tablet by mouth 2 (two) times daily.  07/24/20  [provider]  ?clonazePAM (KLONOPIN) 0.5 MG tablet Take 1 tablet (0.5 mg total) by mouth 2 (two) times daily as needed for anxiety. 10/24/20 02/19/21  Tommie Sams, DO  ?venlafaxine XR (EFFEXOR XR) 75 MG 24 hr capsule Take 1 capsule (75 mg total) by mouth daily with breakfast. 10/24/20 02/19/21  Tommie Sams, DO  ? ? ?Family History ?Family History  ?Problem  Relation Age of Onset  ? Diabetes Mother   ? Pulmonary embolism Sister   ? Deep vein thrombosis Sister   ? Cancer Other   ? Breast cancer Neg Hx   ? ? ?Social History ?Social History  ? ?Tobacco Use  ? Smoking status: Every Day  ?  Packs/day: 1.75  ?  Types: Cigarettes  ? Smokeless tobacco: Never  ?Vaping Use  ? Vaping Use: Some days  ?Substance Use Topics  ? Alcohol use: Yes  ?  Comment: occasionally  ? Drug use: No  ? ? ? ?Allergies   ?Patient has no known allergies. ? ? ?Review of Systems ?Review of Systems  ?Constitutional:  Positive for chills, diaphoresis and fatigue. Negative for fever.  ?HENT:  Positive for postnasal drip. Negative for congestion, ear discharge, ear pain and sore throat.   ?Respiratory:  Positive for cough and wheezing.   ?Musculoskeletal:  Negative for myalgias.  ?Neurological:  Negative for headaches.  ? ? ?Physical Exam ?Triage Vital Signs ?ED Triage Vitals  ?Enc Vitals Group  ?   BP 01/09/22 1106 110/70  ?   Pulse Rate 01/09/22 1106 78  ?   Resp 01/09/22 1106 18  ?   Temp 01/09/22 1106 98.4 ?F (36.9 ?C)  ?   Temp Source 01/09/22 1106 Oral  ?   SpO2 01/09/22 1106 100 %  ?  Weight --   ?   Height 01/09/22 1104 5\' 9"  (1.753 m)  ?   Head Circumference --   ?   Peak Flow --   ?   Pain Score 01/09/22 1103 9  ?   Pain Loc --   ?   Pain Edu? --   ?   Excl. in GC? --   ? ?No data found. ? ?Updated Vital Signs ?BP 110/70 (BP Location: Left Arm)   Pulse 78   Temp 98.4 ?F (36.9 ?C) (Oral)   Resp 18   Ht 5\' 9"  (1.753 m)   SpO2 100%   BMI 23.05 kg/m?  ? ?Visual Acuity ?Right Eye Distance:   ?Left Eye Distance:   ?Bilateral Distance:   ? ?Right Eye Near:   ?Left Eye Near:    ?Bilateral Near:    ? ? ?Physical Exam ?Constitutional:   ?   General: He is not in acute distress. ?   Appearance: He is not toxic-appearing.  ?HENT:  ?   Head: Normocephalic.  ?   Right Ear: Tympanic membrane, ear canal and external ear normal.  ?   Left Ear: Ear canal and external ear normal.  ?   Nose: Nose normal.  ?    Mouth/Throat:  ?   Mouth: Mucous membranes are moist.  ?   Pharynx: Oropharynx is clear.  ?Eyes:  ?   General: No scleral icterus. ?   Conjunctiva/sclera: Conjunctivae normal.  ?Cardiovascular:  ?   Rate and Rhythm: Normal rate and regular rhythm.  ?   Heart sounds: No murmur heard.  ? ?Pulmonary:  ?   Effort: Pulmonary effort is normal. No respiratory distress.  ?   Breath sounds: Mild Wheezing present. Which resolved after duo neb. ? ?Musculoskeletal:     ?   General: Normal range of motion.  ?   Cervical back: Neck supple.  ?Lymphadenopathy:  ?   Cervical: No cervical adenopathy.  ?Skin: ?   General: Skin is warm and dry.  ?   Findings: No rash.  ?Neurological:  ?   Mental Status: He is alert and oriented to person, place, and time.  ?   Gait: Gait normal.  ?Psychiatric:     ?   Mood and Affect: Mood normal.     ?   Behavior: Behavior normal.     ?   Thought Content: Thought content normal.     ?   Judgment: Judgment normal.  ? ? ?UC Treatments / Results  ?Labs ?(all labs ordered are listed, but only abnormal results are displayed) ?Labs Reviewed - No data to display ? ?EKG ? ? ?Radiology ?DG Chest 2 View ? ?Result Date: 01/09/2022 ?CLINICAL DATA:  Cough, back pain EXAM: CHEST - 2 VIEW COMPARISON:  07/24/2020 FINDINGS: The heart size and mediastinal contours are within normal limits. Both lungs are clear. The visualized skeletal structures are unremarkable. IMPRESSION: No active cardiopulmonary disease. Electronically Signed   By: 01/11/2022 M.D.   On: 01/09/2022 12:00   ? ?Procedures ?Procedures (including critical care time) ? ?Medications Ordered in UC ?Medications  ?ipratropium-albuterol (DUONEB) 0.5-2.5 (3) MG/3ML nebulizer solution 3 mL (3 mLs Nebulization Given 01/09/22 1154)  ? ? ?Initial Impression / Assessment and Plan / UC Course  ?I have reviewed the triage vital signs and the nursing notes. ? ?Pertinent  imaging results that were available during my care of the patient were reviewed by me and  considered in my medical decision making (see  chart for details). ? ?Acute bronchitis ?I placed her on doxy and Albuterol inhaler as noted.  ?Needs to d/c smoking.  ?Final Clinical Impressions(s) / UC Diagnoses  ? ?Final diagnoses:  ?Acute bronchitis, unspecified organism  ? ?Discharge Instructions   ?None ?  ? ?ED Prescriptions   ? ? Medication Sig Dispense Auth. Provider  ? doxycycline (VIBRAMYCIN) 100 MG capsule Take 1 capsule (100 mg total) by mouth 2 (two) times daily. 20 capsule Rodriguez-Southworth, Nettie ElmSylvia, PA-C  ? albuterol (VENTOLIN HFA) 108 (90 Base) MCG/ACT inhaler Inhale 2 puffs into the lungs every 4 (four) hours as needed for wheezing or shortness of breath. 18 g Rodriguez-Southworth, Nettie ElmSylvia, PA-C  ? ?  ? ?PDMP not reviewed this encounter. ?  ?Garey HamRodriguez-Southworth, Everlyn Farabaugh, PA-C ?01/09/22 1624 ? ?

## 2022-01-09 NOTE — ED Triage Notes (Signed)
Pt c/o cough, trouble breathing when laying down, wheezing and "rattling" sounds when breathing. Courtney [name].  ? ?Pt husband was seen in the ER for pneumonia and was told to come here for an evaluation.  ?

## 2022-06-28 ENCOUNTER — Other Ambulatory Visit: Payer: Self-pay | Admitting: Family Medicine

## 2022-06-28 DIAGNOSIS — Z1231 Encounter for screening mammogram for malignant neoplasm of breast: Secondary | ICD-10-CM

## 2022-06-30 ENCOUNTER — Ambulatory Visit
Admission: RE | Admit: 2022-06-30 | Discharge: 2022-06-30 | Disposition: A | Discharge status: Home / Self Care | Payer: BC Managed Care – PPO | Source: Ambulatory Visit | Attending: Family Medicine | Admitting: Family Medicine

## 2022-06-30 DIAGNOSIS — Z1231 Encounter for screening mammogram for malignant neoplasm of breast: Secondary | ICD-10-CM

## 2022-12-08 ENCOUNTER — Ambulatory Visit (INDEPENDENT_AMBULATORY_CARE_PROVIDER_SITE_OTHER): Payer: BC Managed Care – PPO

## 2022-12-08 ENCOUNTER — Ambulatory Visit
Admission: EM | Admit: 2022-12-08 | Discharge: 2022-12-08 | Disposition: A | Discharge status: Home / Self Care | Payer: BC Managed Care – PPO

## 2022-12-08 DIAGNOSIS — J40 Bronchitis, not specified as acute or chronic: Secondary | ICD-10-CM | POA: Diagnosis not present

## 2022-12-08 MED ORDER — DOXYCYCLINE HYCLATE 100 MG PO CAPS: 100.0000 mg | ORAL_CAPSULE | Freq: Two times a day (BID) | ORAL | 0 refills | Status: DC

## 2022-12-08 MED ORDER — ALBUTEROL SULFATE HFA 108 (90 BASE) MCG/ACT IN AERS: 2.0000 | INHALATION_SPRAY | RESPIRATORY_TRACT | 0 refills | Status: AC | PRN

## 2022-12-08 MED ORDER — BENZONATATE 100 MG PO CAPS: 200.0000 mg | ORAL_CAPSULE | Freq: Three times a day (TID) | ORAL | 0 refills | Status: DC

## 2022-12-08 MED ORDER — PROMETHAZINE-DM 6.25-15 MG/5ML PO SYRP: 5.0000 mL | ORAL_SOLUTION | Freq: Four times a day (QID) | ORAL | 0 refills | Status: DC | PRN

## 2022-12-08 NOTE — ED Triage Notes (Signed)
Pt c/o cough x3 wks. Has tried cough pills & syrup w/o relief. Hx of bronchitis.

## 2022-12-08 NOTE — Discharge Instructions (Addendum)
Use the albuterol inhaler/nebulizer every 4-6 hours as needed for shortness of breath, wheezing, and cough.  Take the doxycycline, 100 g twice daily with food, for treatment of your bronchitis.  Use the Tessalon Perles every 8 hours for your cough.  Taken with a small sip of water.  They may give you some numbness to the base of your tongue or metallic taste in her mouth, this is normal.  They are designed to calm down the cough reflex.  Use the Promethazine DM cough syrup at bedtime as will make you drowsy.  You may take 1 teaspoon (5 mL) every 6 hours.  Return for reevaluation for new or worsening symptoms.

## 2022-12-08 NOTE — ED Provider Notes (Signed)
[facility]    CSN: [id number] Arrival date & time: 12/08/22  1355      History   Chief Complaint Chief Complaint  Patient presents with   Cough    HPI Courtney [name] is a 47 y.o. female.   HPI  32 old female here for evaluation respiratory complaints.  The patient has a past medical history significant for thyroid disease with thyroidectomy, abdominal hysterectomy, and chronic bronchitis.  She is also a smoker.  She presents for evaluation of 3 weeks worth of a cough that is intermittently productive for a yellow-green mucus.  In the last 2 days she has had a subjective fever but not a measured fever.  Her symptoms have also consisted of runny nose, nasal congestion, shortness breath, and wheezing.  She denies sore throat or ear pain.  Past Medical History:  Diagnosis Date   Chronic bronchitis ([place])    Thyroid disease     Patient Active Problem List   Diagnosis Date Noted   Pre-employment drug screening 05/27/2020    Past Surgical History:  Procedure Laterality Date   ABDOMINAL HYSTERECTOMY     THYROIDECTOMY      OB History   No obstetric history on file.      Home Medications    Prior to Admission medications   Medication Sig Start Date End Date Taking? Authorizing Provider  albuterol (VENTOLIN HFA) 108 (90 Base) MCG/ACT inhaler Inhale 2 puffs into the lungs every 4 (four) hours as needed. 12/08/22  Yes Margarette Canada, NP  benzonatate (TESSALON) 100 MG capsule Take 2 capsules (200 mg total) by mouth every 8 (eight) hours. 12/08/22  Yes Margarette Canada, NP  doxycycline (VIBRAMYCIN) 100 MG capsule Take 1 capsule (100 mg total) by mouth 2 (two) times daily. 12/08/22  Yes Margarette Canada, NP  gabapentin (NEURONTIN) 100 MG capsule Take 100 mg by mouth 3 (three) times daily. 11/20/22  Yes [provider]  hydrOXYzine (ATARAX) 25 MG tablet Take by mouth. 11/22/22  Yes [provider]  levothyroxine (SYNTHROID, LEVOTHROID) 112 MCG tablet  04/23/18   Yes [provider]  promethazine-dextromethorphan (PROMETHAZINE-DM) 6.25-15 MG/5ML syrup Take 5 mLs by mouth 4 (four) times daily as needed. 12/08/22  Yes Margarette Canada, NP  calcium citrate-vitamin D (CITRACAL+D) 315-200 MG-UNIT tablet Take 1 tablet by mouth 2 (two) times daily.  07/24/20  [provider]  clonazePAM (KLONOPIN) 0.5 MG tablet Take 1 tablet (0.5 mg total) by mouth 2 (two) times daily as needed for anxiety. 10/24/20 02/19/21  Coral Spikes, DO  venlafaxine XR (EFFEXOR XR) 75 MG 24 hr capsule Take 1 capsule (75 mg total) by mouth daily with breakfast. 10/24/20 02/19/21  Coral Spikes, DO    Family History Family History  Problem Relation Age of Onset   Diabetes Mother    Pulmonary embolism Sister    Deep vein thrombosis Sister    Cancer Other    Breast cancer Neg Hx     Social History Social History   Tobacco Use   Smoking status: Every Day    Packs/day: 1.75    Types: Cigarettes   Smokeless tobacco: Never  Vaping Use   Vaping Use: Some days  Substance Use Topics   Alcohol use: Yes    Comment: occasionally   Drug use: No     Allergies   Patient has no known allergies.   Review of Systems Review of Systems  Constitutional:  Positive for fever.  HENT:  Positive for  congestion and rhinorrhea. Negative for sore throat.   Respiratory:  Positive for cough, shortness of breath and wheezing.      Physical Exam Triage Vital Signs ED Triage Vitals  Enc Vitals Group     BP      Pulse      Resp      Temp      Temp src      SpO2      Weight      Height      Head Circumference      Peak Flow      Pain Score      Pain Loc      Pain Edu?      Excl. in [place]?    No data found.  Updated Vital Signs BP 127/75 (BP Location: Left Arm)   Pulse 76   Temp 98.6 F (37 C) (Oral)   Resp 16   Ht '5\' 8"'$  (1.727 m)   Wt 140 lb (63.5 kg)   SpO2 95%   BMI 21.29 kg/m   Visual Acuity Right Eye Distance:   Left Eye Distance:   Bilateral Distance:     Right Eye Near:   Left Eye Near:    Bilateral Near:     Physical Exam Vitals and nursing note reviewed.  Constitutional:      Appearance: Normal appearance. She is not ill-appearing.  HENT:     Head: Normocephalic and atraumatic.     Right Ear: Tympanic membrane, ear canal and external ear normal. There is no impacted cerumen.     Left Ear: Tympanic membrane, ear canal and external ear normal. There is no impacted cerumen.     Nose: Congestion and rhinorrhea present.     Comments: Nasal mucosa is erythematous with mild edema and scant clear discharge in both nares.    Mouth/Throat:     Mouth: Mucous membranes are moist.     Pharynx: Oropharynx is clear. Posterior oropharyngeal erythema present. No oropharyngeal exudate.     Comments: Patient has erythema and injection to the posterior oropharynx with mild clear postnasal drip. Cardiovascular:     Rate and Rhythm: Normal rate and regular rhythm.     Pulses: Normal pulses.     Heart sounds: Normal heart sounds. No murmur heard.    No friction rub. No gallop.  Pulmonary:     Effort: Pulmonary effort is normal.     Breath sounds: Wheezing present.     Comments: Patient has decreased lung sounds in all lung fields with scattered expiratory wheezing. Musculoskeletal:     Cervical back: Normal range of motion and neck supple.  Lymphadenopathy:     Cervical: No cervical adenopathy.  Neurological:     Mental Status: She is alert.      UC Treatments / Results  Labs (all labs ordered are listed, but only abnormal results are displayed) Labs Reviewed - No data to display  EKG   Radiology DG Chest 2 View  Result Date: 12/08/2022 CLINICAL DATA:  Cough and shortness of breath EXAM: CHEST - 2 VIEW COMPARISON:  Chest x-ray 01/09/2022 FINDINGS: The heart size and mediastinal contours are within normal limits. Both lungs are clear. The visualized skeletal structures are unremarkable. IMPRESSION: No active cardiopulmonary disease.  Electronically Signed   By: Ronney Asters M.D.   On: 12/08/2022 15:54    Procedures Procedures (including critical care time)  Medications Ordered in UC Medications - No data to display  Initial Impression /  Assessment and Plan / UC Course  I have reviewed the triage vital signs and the nursing notes.  Pertinent labs & imaging results that were available during my care of the patient were reviewed by me and considered in my medical decision making (see chart for details).   Patient is a pleasant, nontoxic-appearing 47 year old female with a history of chronic bronchitis presenting for evaluation of 3 weeks worth of cough as outlined in HPI above.  She is not in any respiratory distress at this time and she can speak in full sentences without dyspnea or tachypnea.  Her respiratory rate at triage was 16 with a 95% room air oxygen saturation.  She is afebrile at 98.6.  Patient does have an elevated blood pressure of 132/117.  I will have staff recheck that blood pressure.  Patient's cardiopulmonary exam reveals diffusely decreased lung sounds in all lung fields with expiratory wheezing in her upper lung fields bilaterally.  I will obtain chest x-ray to look for any acute cardiopulmonary pathology.  Patient's blood pressure has improved at 127/75.  Radiology impression chest x-ray states no active cardiopulmonary disease.  Given that patient has a history of chronic bronchitis I will treat her for bronchitis with doxycycline 100 mg twice daily for 10 days, albuterol inhaler every 4-6 hours to help with shortness breath and wheezing, Tessalon Perles, and Promethazine DM cough syrup.   Final Clinical Impressions(s) / UC Diagnoses   Final diagnoses:  Bronchitis     Discharge Instructions      Use the albuterol inhaler/nebulizer every 4-6 hours as needed for shortness of breath, wheezing, and cough.  Take the doxycycline, 100 g twice daily with food, for treatment of your bronchitis.  Use  the Tessalon Perles every 8 hours for your cough.  Taken with a small sip of water.  They may give you some numbness to the base of your tongue or metallic taste in her mouth, this is normal.  They are designed to calm down the cough reflex.  Use the Promethazine DM cough syrup at bedtime as will make you drowsy.  You may take 1 teaspoon (5 mL) every 6 hours.  Return for reevaluation for new or worsening symptoms.      ED Prescriptions     Medication Sig Dispense Auth. Provider   doxycycline (VIBRAMYCIN) 100 MG capsule Take 1 capsule (100 mg total) by mouth 2 (two) times daily. 20 capsule Margarette Canada, NP   benzonatate (TESSALON) 100 MG capsule Take 2 capsules (200 mg total) by mouth every 8 (eight) hours. 21 capsule Margarette Canada, NP   albuterol (VENTOLIN HFA) 108 (90 Base) MCG/ACT inhaler Inhale 2 puffs into the lungs every 4 (four) hours as needed. 18 g Margarette Canada, NP   promethazine-dextromethorphan (PROMETHAZINE-DM) 6.25-15 MG/5ML syrup Take 5 mLs by mouth 4 (four) times daily as needed. 118 mL Margarette Canada, NP      PDMP not reviewed this encounter.   Margarette Canada, NP 12/08/22 1600

## 2023-07-16 IMAGING — MG MM DIGITAL SCREENING BILAT W/ TOMO AND CAD
6 of 10 series · 6 of 30 positions shown · non-contrast
Comparison: None.

CLINICAL DATA: Screening.

EXAM:
DIGITAL SCREENING BILATERAL MAMMOGRAM WITH TOMOSYNTHESIS AND CAD
TECHNIQUE: Bilateral screening digital craniocaudal and mediolateral oblique
mammograms were obtained. Bilateral screening digital breast
tomosynthesis was performed. The images were evaluated with
computer-aided detection.

[R MLO synth-2D]
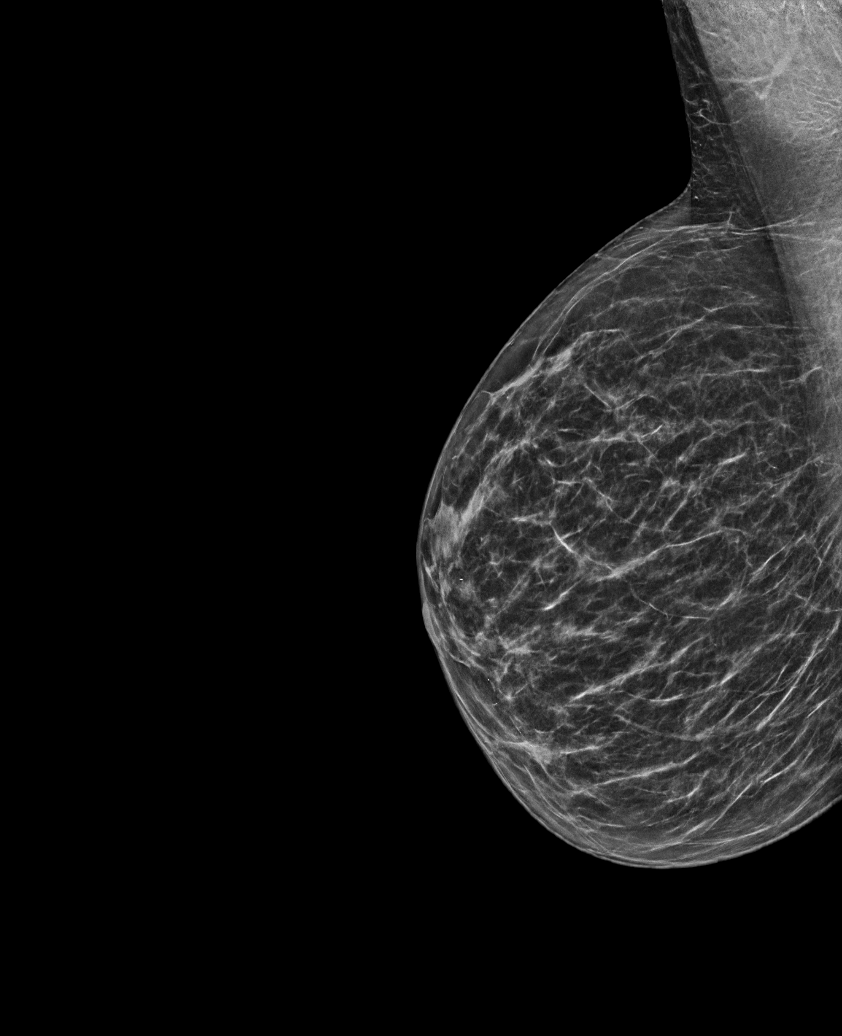

[L XCCL synth-2D]
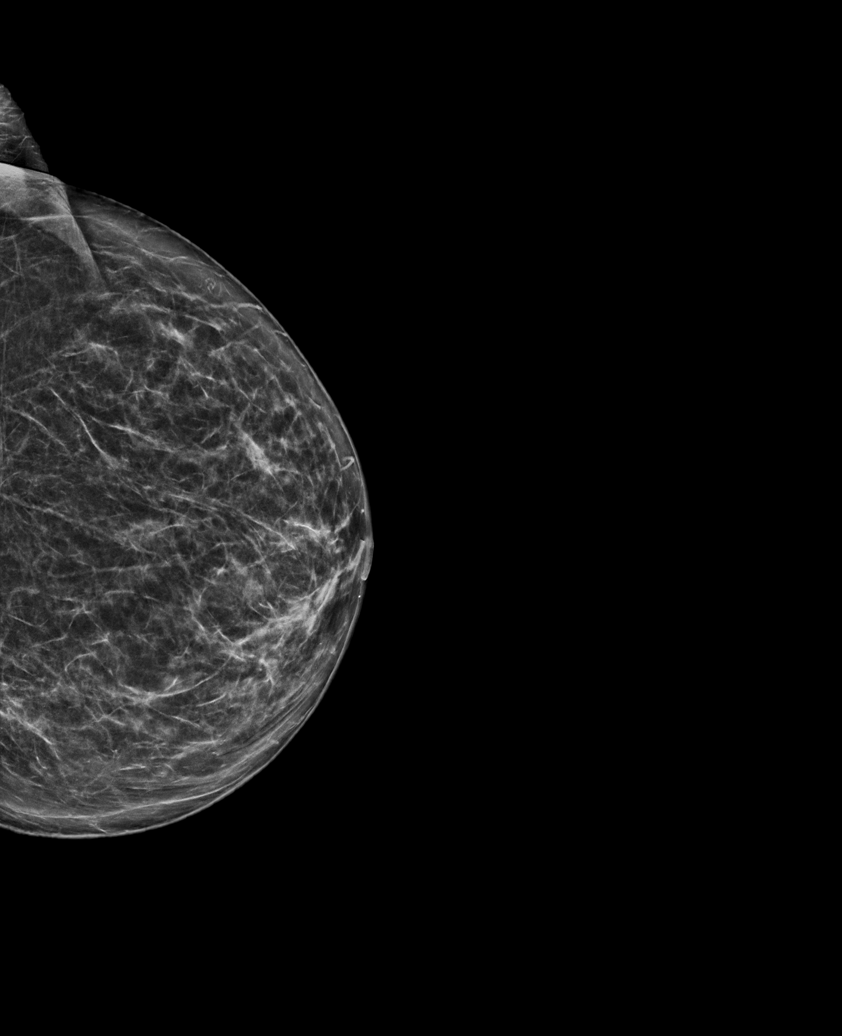

[L MLO synth-2D]
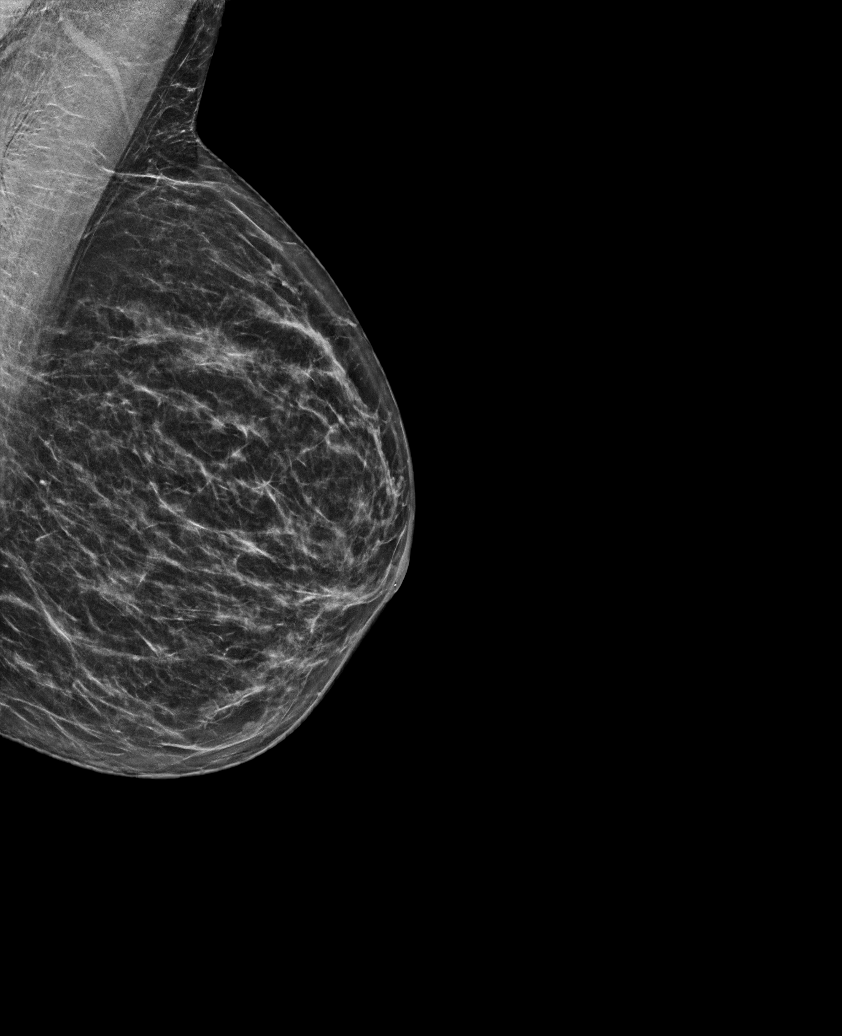

[R CC synth-2D]
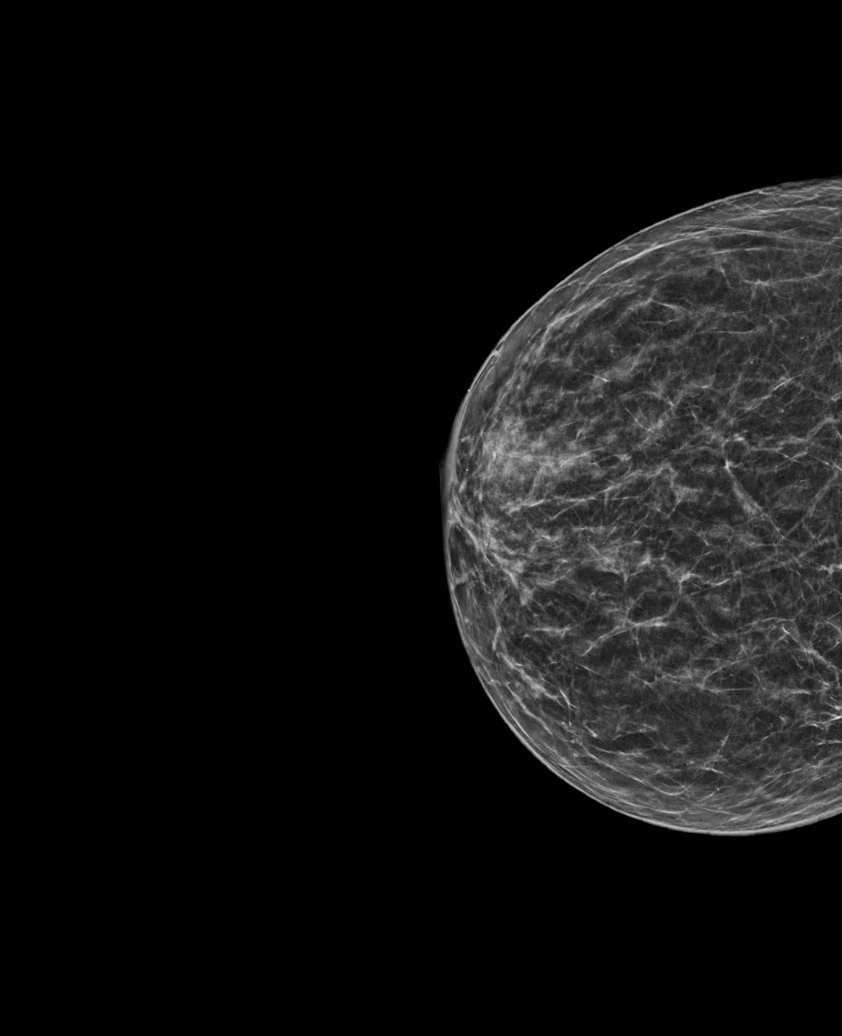

[L CC synth-2D]
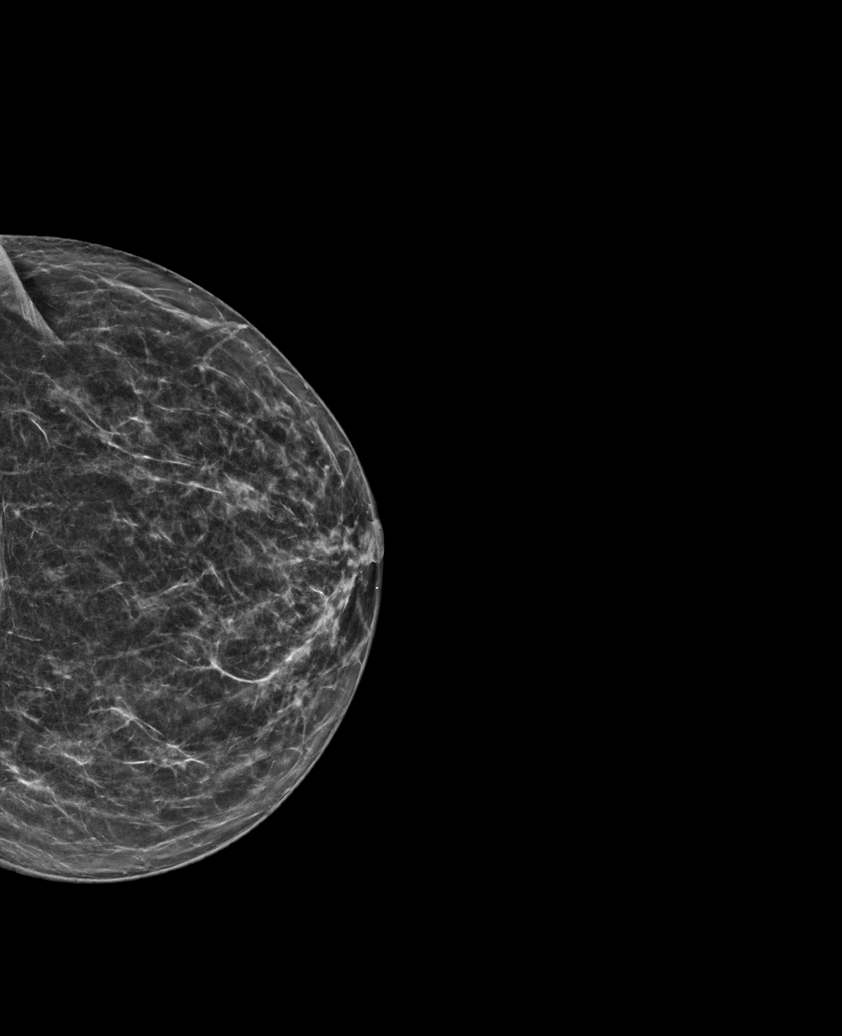

[R CC tomo · tomo slice 30/59.0]
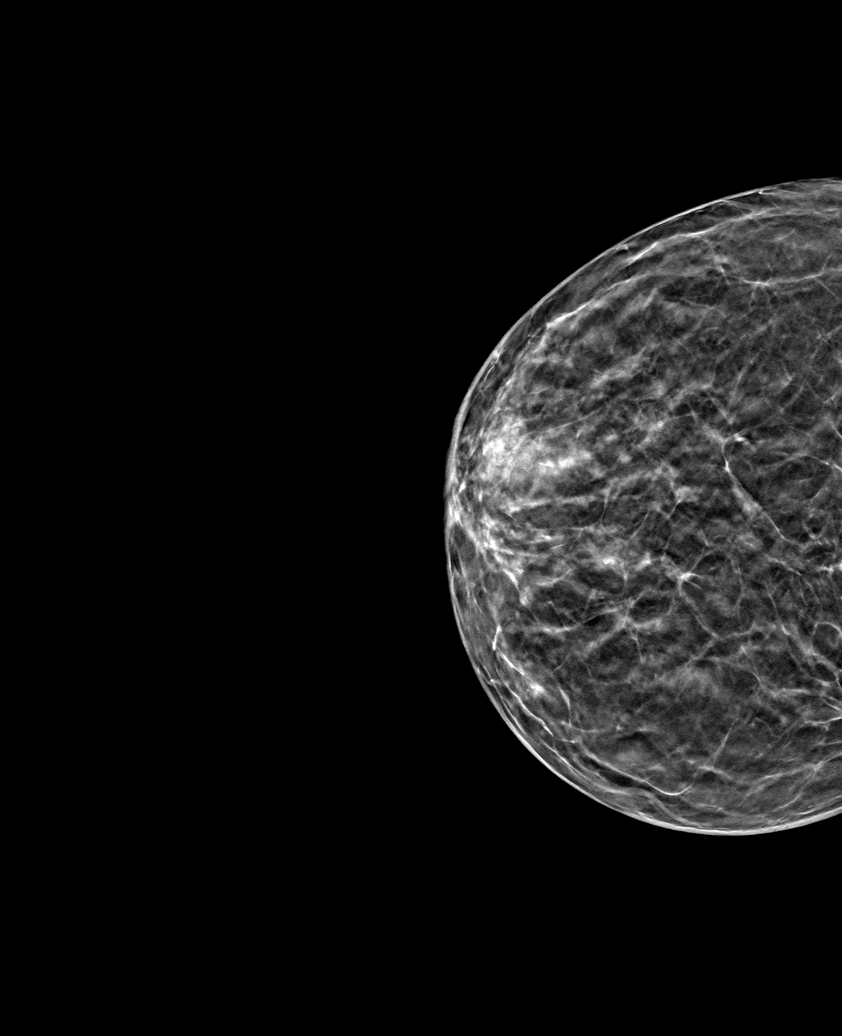

[6 of 30 positions shown; findings below may reference images not displayed]

ACR Breast Density Category b: There are scattered areas of
fibroglandular density.
FINDINGS: There are no findings suspicious for malignancy.
IMPRESSION: No mammographic evidence of malignancy. A result letter of this
screening mammogram will be mailed directly to the patient.

RECOMMENDATION:
Screening mammogram in one year. (Code:XG-X-X7B)

BI-RADS CATEGORY  1: Negative.

## 2024-01-01 IMAGING — CR DG CHEST 2V
2 series · 2 of 2 positions shown · non-contrast
Comparison: 07/24/2020

CLINICAL DATA: Cough, back pain

EXAM:
CHEST - 2 VIEW

[chest pa]
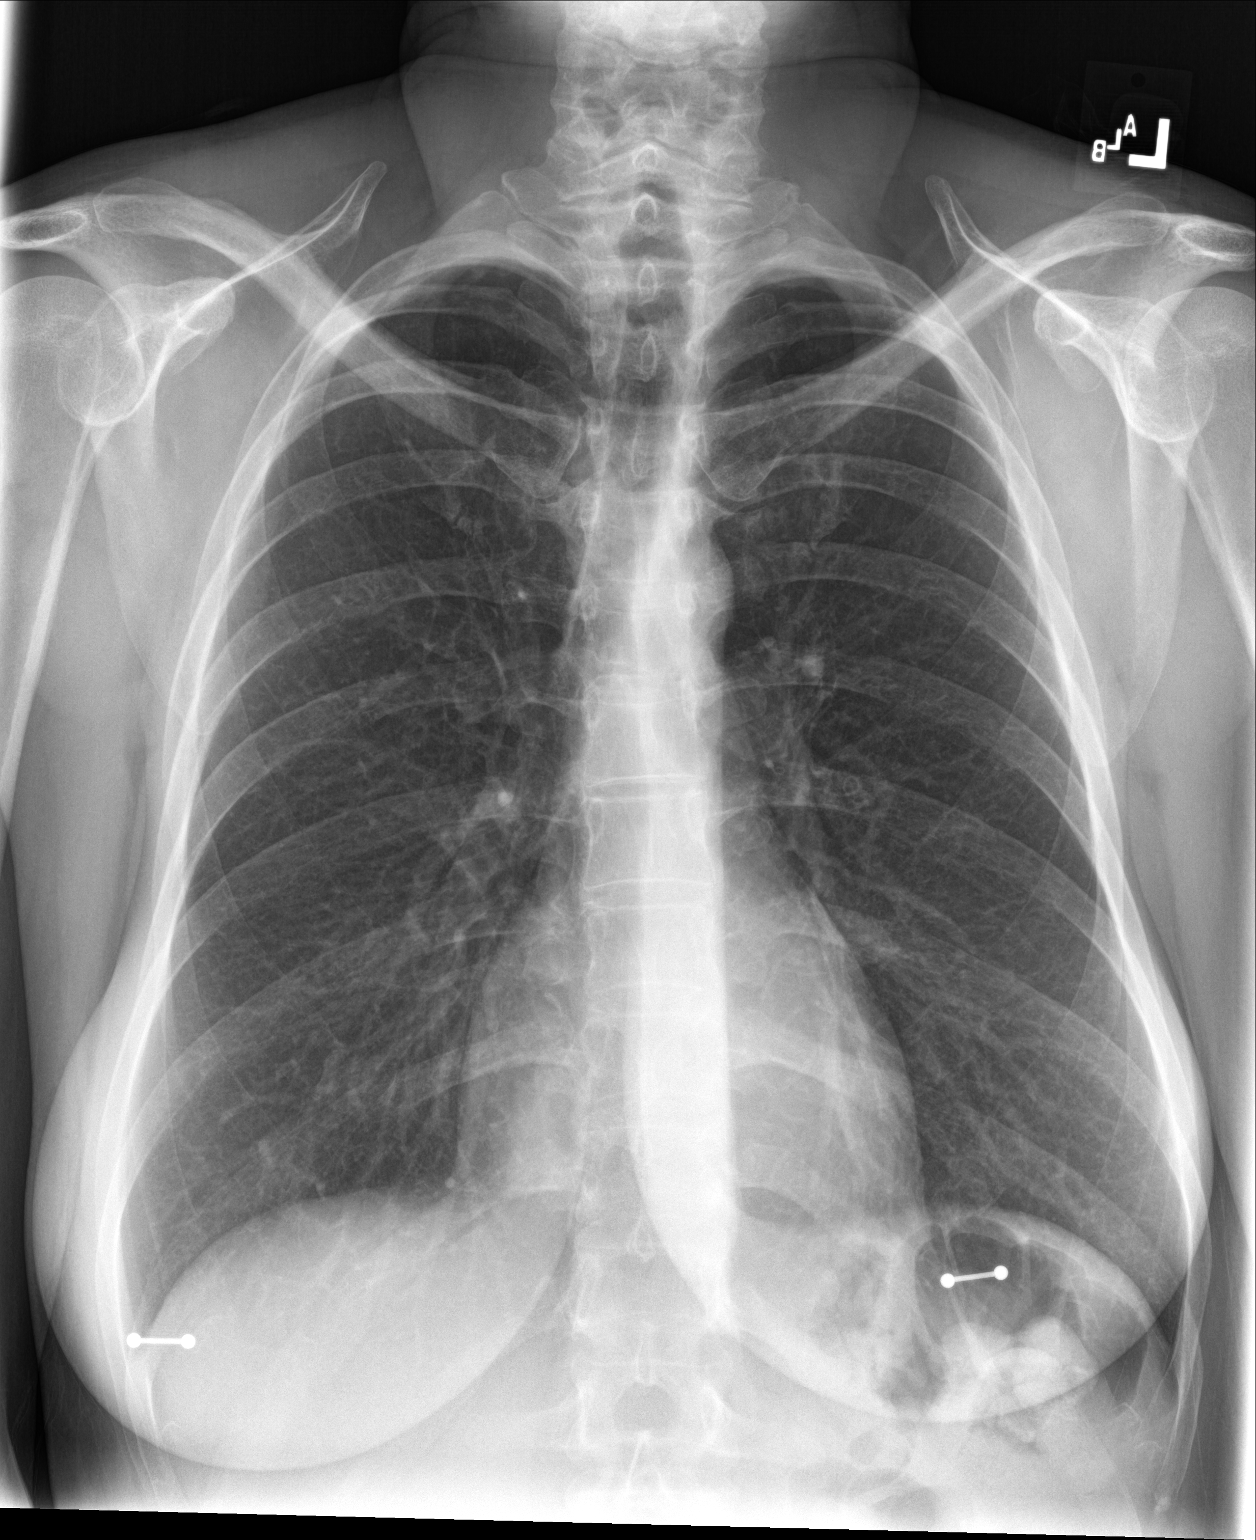

[chest lat]
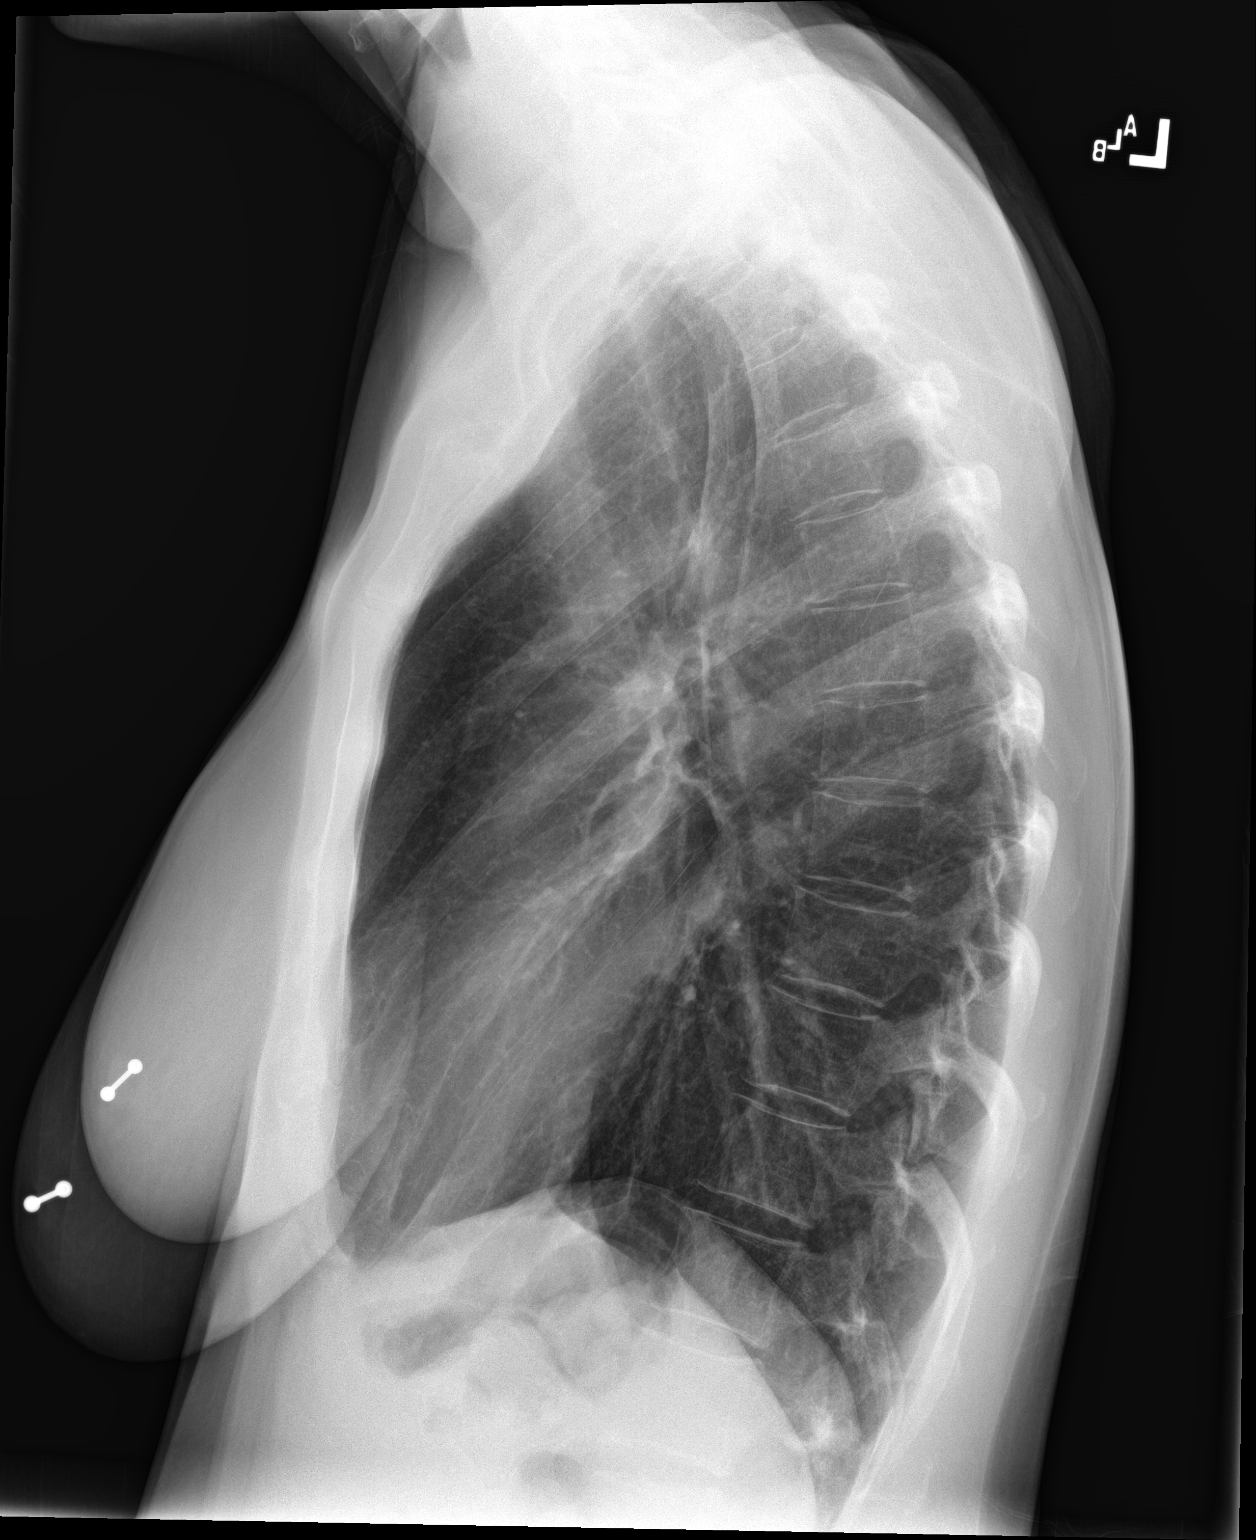

[2 of 2 positions shown; findings below may reference images not displayed]

FINDINGS: The heart size and mediastinal contours are within normal limits.
Both lungs are clear. The visualized skeletal structures are
unremarkable.
IMPRESSION: No active cardiopulmonary disease.

## 2024-10-02 ENCOUNTER — Inpatient Hospital Stay
Admission: EM | Admit: 2024-10-02 | Discharge: 2024-10-04 | DRG: 872 | Disposition: A | Discharge status: Home / Self Care | Payer: MEDICAID | Attending: Internal Medicine | Admitting: Internal Medicine

## 2024-10-02 ENCOUNTER — Encounter: Payer: Self-pay | Admitting: Emergency Medicine

## 2024-10-02 ENCOUNTER — Emergency Department: Payer: MEDICAID

## 2024-10-02 ENCOUNTER — Other Ambulatory Visit: Payer: Self-pay

## 2024-10-02 DIAGNOSIS — N390 Urinary tract infection, site not specified: Principal | ICD-10-CM | POA: Diagnosis present

## 2024-10-02 DIAGNOSIS — E872 Acidosis, unspecified: Secondary | ICD-10-CM | POA: Diagnosis present

## 2024-10-02 DIAGNOSIS — J4489 Other specified chronic obstructive pulmonary disease: Secondary | ICD-10-CM | POA: Diagnosis present

## 2024-10-02 DIAGNOSIS — Z87891 Personal history of nicotine dependence: Secondary | ICD-10-CM

## 2024-10-02 DIAGNOSIS — E119 Type 2 diabetes mellitus without complications: Secondary | ICD-10-CM | POA: Diagnosis present

## 2024-10-02 DIAGNOSIS — Z833 Family history of diabetes mellitus: Secondary | ICD-10-CM

## 2024-10-02 DIAGNOSIS — N12 Tubulo-interstitial nephritis, not specified as acute or chronic: Secondary | ICD-10-CM | POA: Diagnosis present

## 2024-10-02 DIAGNOSIS — Z79890 Hormone replacement therapy (postmenopausal): Secondary | ICD-10-CM

## 2024-10-02 DIAGNOSIS — Z90710 Acquired absence of both cervix and uterus: Secondary | ICD-10-CM

## 2024-10-02 DIAGNOSIS — N83202 Unspecified ovarian cyst, left side: Secondary | ICD-10-CM | POA: Diagnosis present

## 2024-10-02 DIAGNOSIS — Z1152 Encounter for screening for COVID-19: Secondary | ICD-10-CM

## 2024-10-02 DIAGNOSIS — R319 Hematuria, unspecified: Secondary | ICD-10-CM | POA: Diagnosis present

## 2024-10-02 DIAGNOSIS — A419 Sepsis, unspecified organism: Principal | ICD-10-CM | POA: Diagnosis present

## 2024-10-02 DIAGNOSIS — E890 Postprocedural hypothyroidism: Secondary | ICD-10-CM | POA: Diagnosis present

## 2024-10-02 DIAGNOSIS — Z79899 Other long term (current) drug therapy: Secondary | ICD-10-CM

## 2024-10-02 LAB — CBC
HCT: 37 % (ref 36.0–46.0)
Hemoglobin: 12.2 g/dL (ref 12.0–15.0)
MCH: 29.8 pg (ref 26.0–34.0)
MCHC: 33 g/dL (ref 30.0–36.0)
MCV: 90.5 fL (ref 80.0–100.0)
Platelets: 174 K/uL (ref 150–400)
RBC: 4.09 MIL/uL (ref 3.87–5.11)
RDW: 13.5 % (ref 11.5–15.5)
WBC: 10.9 K/uL — ABNORMAL HIGH (ref 4.0–10.5)
nRBC: 0 % (ref 0.0–0.2)

## 2024-10-02 LAB — URINALYSIS, ROUTINE W REFLEX MICROSCOPIC
Bilirubin Urine: NEGATIVE
Glucose, UA: NEGATIVE mg/dL
Ketones, ur: NEGATIVE mg/dL
Nitrite: POSITIVE — AB
Protein, ur: 100 mg/dL — AB
Specific Gravity, Urine: 1.014 (ref 1.005–1.030)
WBC, UA: >50 WBC/hpf (ref 0–5)
pH: 5 (ref 5.0–8.0)

## 2024-10-02 LAB — COMPREHENSIVE METABOLIC PANEL WITH GFR
ALT: 9 U/L (ref 0–44)
AST: 13 U/L — ABNORMAL LOW (ref 15–41)
Albumin: 3.7 g/dL (ref 3.5–5.0)
Alkaline Phosphatase: 65 U/L (ref 38–126)
Anion gap: 11 (ref 5–15)
BUN: 9 mg/dL (ref 6–20)
CO2: 24 mmol/L (ref 22–32)
Calcium: 8.6 mg/dL — ABNORMAL LOW (ref 8.9–10.3)
Chloride: 100 mmol/L (ref 98–111)
Creatinine, Ser: 1.04 mg/dL — ABNORMAL HIGH (ref 0.44–1.00)
GFR, Estimated: >60 mL/min
Glucose, Bld: 158 mg/dL — ABNORMAL HIGH (ref 70–99)
Potassium: 3.9 mmol/L (ref 3.5–5.1)
Sodium: 135 mmol/L (ref 135–145)
Total Bilirubin: 0.4 mg/dL (ref 0.0–1.2)
Total Protein: 6.2 g/dL — ABNORMAL LOW (ref 6.5–8.1)

## 2024-10-02 LAB — CBG MONITORING, ED: Glucose-Capillary: 171 mg/dL — ABNORMAL HIGH (ref 70–99)

## 2024-10-02 LAB — TROPONIN T, HIGH SENSITIVITY: Troponin T High Sensitivity: <15 ng/L (ref 0–19)

## 2024-10-02 NOTE — ED Triage Notes (Signed)
 Pt sts  I just don't feel right. Has had a lot of trouble staying awake today. Pts husband sts pt has been disoriented. Has had lower back pain, headaches, chest doesn't feel right, intermittent SOB, emesis x4 without abd pain, and urinary frequency. No drug use. No ETOH.   Pt is A&Ox4. Ambualtory in triage. Accompained by husband.

## 2024-10-03 ENCOUNTER — Emergency Department: Payer: MEDICAID

## 2024-10-03 ENCOUNTER — Other Ambulatory Visit: Payer: Self-pay

## 2024-10-03 DIAGNOSIS — N390 Urinary tract infection, site not specified: Secondary | ICD-10-CM

## 2024-10-03 DIAGNOSIS — A419 Sepsis, unspecified organism: Principal | ICD-10-CM

## 2024-10-03 LAB — BLOOD CULTURE ID PANEL (REFLEXED) - BCID2

## 2024-10-03 LAB — BASIC METABOLIC PANEL WITH GFR
Anion gap: 9 (ref 5–15)
BUN: 9 mg/dL (ref 6–20)
CO2: 24 mmol/L (ref 22–32)
Calcium: 7.7 mg/dL — ABNORMAL LOW (ref 8.9–10.3)
Chloride: 102 mmol/L (ref 98–111)
Creatinine, Ser: 0.92 mg/dL (ref 0.44–1.00)
GFR, Estimated: >60 mL/min
Glucose, Bld: 129 mg/dL — ABNORMAL HIGH (ref 70–99)
Potassium: 3.4 mmol/L — ABNORMAL LOW (ref 3.5–5.1)
Sodium: 134 mmol/L — ABNORMAL LOW (ref 135–145)

## 2024-10-03 LAB — LACTIC ACID, PLASMA
Lactic Acid, Venous: 2.3 mmol/L (ref 0.5–1.9)
Lactic Acid, Venous: 1.1 mmol/L (ref 0.5–1.9)

## 2024-10-03 LAB — HEPATIC FUNCTION PANEL
ALT: 6 U/L (ref 0–44)
AST: 12 U/L — ABNORMAL LOW (ref 15–41)
Albumin: 3 g/dL — ABNORMAL LOW (ref 3.5–5.0)
Alkaline Phosphatase: 48 U/L (ref 38–126)
Bilirubin, Direct: 0.2 mg/dL (ref 0.0–0.2)
Indirect Bilirubin: 0.2 mg/dL — ABNORMAL LOW (ref 0.3–0.9)
Total Bilirubin: 0.4 mg/dL (ref 0.0–1.2)
Total Protein: 5 g/dL — ABNORMAL LOW (ref 6.5–8.1)

## 2024-10-03 LAB — CBC
HCT: 30.4 % — ABNORMAL LOW (ref 36.0–46.0)
Hemoglobin: 10.1 g/dL — ABNORMAL LOW (ref 12.0–15.0)
MCH: 30.1 pg (ref 26.0–34.0)
MCHC: 33.2 g/dL (ref 30.0–36.0)
MCV: 90.5 fL (ref 80.0–100.0)
Platelets: 134 K/uL — ABNORMAL LOW (ref 150–400)
RBC: 3.36 MIL/uL — ABNORMAL LOW (ref 3.87–5.11)
RDW: 13.6 % (ref 11.5–15.5)
WBC: 7.7 K/uL (ref 4.0–10.5)
nRBC: 0 % (ref 0.0–0.2)

## 2024-10-03 LAB — RESP PANEL BY RT-PCR (RSV, FLU A&B, COVID)  RVPGX2
Influenza A by PCR: NEGATIVE
Influenza B by PCR: NEGATIVE
Resp Syncytial Virus by PCR: NEGATIVE
SARS Coronavirus 2 by RT PCR: NEGATIVE

## 2024-10-03 LAB — VITAMIN D 25 HYDROXY (VIT D DEFICIENCY, FRACTURES): Vit D, 25-Hydroxy: 6.5 ng/mL — ABNORMAL LOW (ref 30–100)

## 2024-10-03 LAB — PROCALCITONIN: Procalcitonin: 0.91 ng/mL

## 2024-10-03 LAB — HIV ANTIBODY (ROUTINE TESTING W REFLEX): HIV Screen 4th Generation wRfx: NONREACTIVE

## 2024-10-03 LAB — TROPONIN T, HIGH SENSITIVITY: Troponin T High Sensitivity: <15 ng/L (ref 0–19)

## 2024-10-03 MED ORDER — FENTANYL CITRATE (PF) 50 MCG/ML IJ SOSY
50.0000 ug | PREFILLED_SYRINGE | Freq: Once | INTRAMUSCULAR | Status: AC
  Administered 2024-10-03: 50 ug via INTRAVENOUS
  Filled 2024-10-03: qty 1

## 2024-10-03 MED ORDER — BISACODYL 10 MG RE SUPP
10.0000 mg | Freq: Once | RECTAL | Status: DC
  Filled 2024-10-03: qty 1

## 2024-10-03 MED ORDER — LEVOTHYROXINE SODIUM 112 MCG PO TABS
112.0000 ug | ORAL_TABLET | Freq: Every day | ORAL | Status: DC
  Administered 2024-10-04: 112 ug via ORAL
  Filled 2024-10-03: qty 1

## 2024-10-03 MED ORDER — ACETAMINOPHEN 325 MG PO TABS: 650.0000 mg | ORAL_TABLET | Freq: Four times a day (QID) | ORAL | Status: DC | PRN

## 2024-10-03 MED ORDER — ONDANSETRON HCL 4 MG/2ML IJ SOLN: 4.0000 mg | Freq: Four times a day (QID) | INTRAMUSCULAR | Status: DC | PRN

## 2024-10-03 MED ORDER — HYDROMORPHONE HCL 1 MG/ML IJ SOLN
0.5000 mg | Freq: Once | INTRAMUSCULAR | Status: AC
  Administered 2024-10-03: 0.5 mg via INTRAVENOUS
  Filled 2024-10-03: qty 0.5

## 2024-10-03 MED ORDER — KETOROLAC TROMETHAMINE 30 MG/ML IJ SOLN
30.0000 mg | Freq: Once | INTRAMUSCULAR | Status: AC
  Administered 2024-10-03: 30 mg via INTRAVENOUS
  Filled 2024-10-03: qty 1

## 2024-10-03 MED ORDER — LACTULOSE 10 GM/15ML PO SOLN
30.0000 g | Freq: Two times a day (BID) | ORAL | Status: AC
  Administered 2024-10-03: 30 g via ORAL
  Filled 2024-10-03 (×2): qty 60

## 2024-10-03 MED ORDER — IOHEXOL 350 MG/ML SOLN
100.0000 mL | Freq: Once | INTRAVENOUS | Status: AC | PRN
  Administered 2024-10-03: 100 mL via INTRAVENOUS

## 2024-10-03 MED ORDER — SODIUM CHLORIDE 0.9 % IV SOLN
2.0000 g | INTRAVENOUS | Status: DC
  Filled 2024-10-03: qty 20

## 2024-10-03 MED ORDER — ACETAMINOPHEN 500 MG PO TABS
1000.0000 mg | ORAL_TABLET | Freq: Once | ORAL | Status: AC
  Administered 2024-10-03: 1000 mg via ORAL
  Filled 2024-10-03: qty 2

## 2024-10-03 MED ORDER — LACTATED RINGERS IV SOLN
150.0000 mL/h | INTRAVENOUS | Status: AC
  Administered 2024-10-03 (×2): 150 mL/h via INTRAVENOUS

## 2024-10-03 MED ORDER — ONDANSETRON HCL 4 MG PO TABS
4.0000 mg | ORAL_TABLET | Freq: Four times a day (QID) | ORAL | Status: DC | PRN
  Administered 2024-10-04: 4 mg via ORAL
  Filled 2024-10-03: qty 1

## 2024-10-03 MED ORDER — PANTOPRAZOLE SODIUM 40 MG PO TBEC
40.0000 mg | DELAYED_RELEASE_TABLET | Freq: Two times a day (BID) | ORAL | Status: DC
  Administered 2024-10-03 – 2024-10-04 (×2): 40 mg via ORAL
  Filled 2024-10-03 (×2): qty 1

## 2024-10-03 MED ORDER — LACTATED RINGERS IV SOLN: INTRAVENOUS | Status: DC

## 2024-10-03 MED ORDER — SODIUM CHLORIDE 0.9 % IV SOLN
1.0000 g | INTRAVENOUS | Status: DC
  Administered 2024-10-03: 1 g via INTRAVENOUS
  Filled 2024-10-03: qty 10

## 2024-10-03 MED ORDER — SODIUM CHLORIDE 0.9 % IV SOLN
2.0000 g | Freq: Once | INTRAVENOUS | Status: AC
  Administered 2024-10-03: 2 g via INTRAVENOUS
  Filled 2024-10-03: qty 12.5

## 2024-10-03 MED ORDER — ONDANSETRON HCL 4 MG/2ML IJ SOLN
4.0000 mg | Freq: Once | INTRAMUSCULAR | Status: AC
  Administered 2024-10-03: 4 mg via INTRAVENOUS
  Filled 2024-10-03: qty 2

## 2024-10-03 MED ORDER — KETOROLAC TROMETHAMINE 30 MG/ML IJ SOLN
30.0000 mg | Freq: Four times a day (QID) | INTRAMUSCULAR | Status: DC | PRN
  Administered 2024-10-03 – 2024-10-04 (×3): 30 mg via INTRAVENOUS
  Filled 2024-10-03 (×3): qty 1

## 2024-10-03 MED ORDER — HYDROCODONE-ACETAMINOPHEN 5-325 MG PO TABS
1.0000 | ORAL_TABLET | ORAL | Status: DC | PRN
  Administered 2024-10-03 – 2024-10-04 (×4): 2 via ORAL
  Filled 2024-10-03 (×4): qty 2

## 2024-10-03 MED ORDER — LACTATED RINGERS IV BOLUS (SEPSIS)
1000.0000 mL | Freq: Once | INTRAVENOUS | Status: AC
  Administered 2024-10-03: 1000 mL via INTRAVENOUS

## 2024-10-03 MED ORDER — ENOXAPARIN SODIUM 40 MG/0.4ML IJ SOSY
40.0000 mg | PREFILLED_SYRINGE | INTRAMUSCULAR | Status: DC
  Administered 2024-10-03: 40 mg via SUBCUTANEOUS
  Filled 2024-10-03: qty 0.4

## 2024-10-03 MED ORDER — METRONIDAZOLE 500 MG/100ML IV SOLN
500.0000 mg | Freq: Once | INTRAVENOUS | Status: AC
  Administered 2024-10-03: 500 mg via INTRAVENOUS
  Filled 2024-10-03: qty 100

## 2024-10-03 MED ORDER — SODIUM CHLORIDE 0.9 % IV BOLUS
500.0000 mL | Freq: Once | INTRAVENOUS | Status: AC
  Administered 2024-10-03: 500 mL via INTRAVENOUS

## 2024-10-03 MED ORDER — HYDROXYZINE HCL 25 MG PO TABS
25.0000 mg | ORAL_TABLET | Freq: Once | ORAL | Status: AC
  Administered 2024-10-03: 25 mg via ORAL
  Filled 2024-10-03: qty 1

## 2024-10-03 MED ORDER — ORAL CARE MOUTH RINSE: 15.0000 mL | OROMUCOSAL | Status: DC | PRN

## 2024-10-03 MED ORDER — ALBUTEROL SULFATE (2.5 MG/3ML) 0.083% IN NEBU: 2.5000 mg | INHALATION_SOLUTION | RESPIRATORY_TRACT | Status: DC | PRN

## 2024-10-03 MED ORDER — VANCOMYCIN HCL IN DEXTROSE 1-5 GM/200ML-% IV SOLN
1000.0000 mg | Freq: Once | INTRAVENOUS | Status: AC
  Administered 2024-10-03: 1000 mg via INTRAVENOUS
  Filled 2024-10-03: qty 200

## 2024-10-03 MED ORDER — SODIUM CHLORIDE 0.9 % IV BOLUS
1000.0000 mL | Freq: Once | INTRAVENOUS | Status: AC
  Administered 2024-10-03: 1000 mL via INTRAVENOUS

## 2024-10-03 MED ORDER — ACETAMINOPHEN 650 MG RE SUPP: 650.0000 mg | Freq: Four times a day (QID) | RECTAL | Status: DC | PRN

## 2024-10-03 MED ORDER — CALCIUM CARBONATE ANTACID 500 MG PO CHEW
1.0000 | CHEWABLE_TABLET | Freq: Two times a day (BID) | ORAL | Status: DC
  Administered 2024-10-03: 200 mg via ORAL
  Filled 2024-10-03: qty 1

## 2024-10-03 MED ORDER — CALCIUM GLUCONATE-NACL 1-0.675 GM/50ML-% IV SOLN
1.0000 g | Freq: Once | INTRAVENOUS | Status: AC
  Administered 2024-10-03: 1000 mg via INTRAVENOUS
  Filled 2024-10-03: qty 50

## 2024-10-03 MED ORDER — NICOTINE 21 MG/24HR TD PT24
21.0000 mg | MEDICATED_PATCH | Freq: Every day | TRANSDERMAL | Status: DC
  Administered 2024-10-03: 21 mg via TRANSDERMAL
  Filled 2024-10-03: qty 1

## 2024-10-03 NOTE — ED Provider Notes (Signed)
 "  [facility] Provider Note    Event Date/Time   First MD Initiated Contact with Patient 10/03/24 [phone]     (approximate)   History   Fatigue and Chest Pain   HPI  HANNELORE BOVA is a 49 y.o. female with history of bronchitis, hypothyroidism status post thyroidectomy, type 2 diabetes, depression who presents to the emergency department with complaints of left-sided flank pain, pain with urination, chills, nausea and vomiting, cough, chest pain.  No diarrhea, vaginal bleeding or discharge.  No shortness of breath.  No prior abdominal surgeries.   History provided by patient and husband.    Past Medical History:  Diagnosis Date   Chronic bronchitis (HCC)    Thyroid disease     Past Surgical History:  Procedure Laterality Date   ABDOMINAL HYSTERECTOMY     THYROIDECTOMY      MEDICATIONS:  Prior to Admission medications  Medication Sig Start Date End Date Taking? Authorizing Provider  albuterol  (VENTOLIN  HFA) 108 (90 Base) MCG/ACT inhaler Inhale 2 puffs into the lungs every 4 (four) hours as needed. 12/08/22   Bernardino Ditch, NP  benzonatate  (TESSALON ) 100 MG capsule Take 2 capsules (200 mg total) by mouth every 8 (eight) hours. 12/08/22   Bernardino Ditch, NP  doxycycline  (VIBRAMYCIN ) 100 MG capsule Take 1 capsule (100 mg total) by mouth 2 (two) times daily. 12/08/22   Bernardino Ditch, NP  gabapentin (NEURONTIN) 100 MG capsule Take 100 mg by mouth 3 (three) times daily. 11/20/22   [provider]  hydrOXYzine  (ATARAX ) 25 MG tablet Take by mouth. 11/22/22   [provider]  levothyroxine  (SYNTHROID , LEVOTHROID) 112 MCG tablet  04/23/18   [provider]  promethazine -dextromethorphan (PROMETHAZINE -DM) 6.25-15 MG/5ML syrup Take 5 mLs by mouth 4 (four) times daily as needed. 12/08/22   Bernardino Ditch, NP  calcium  citrate-vitamin D  (CITRACAL+D) 315-200 MG-UNIT tablet Take 1 tablet by mouth 2 (two) times daily.  07/24/20  [provider]   clonazePAM  (KLONOPIN ) 0.5 MG tablet Take 1 tablet (0.5 mg total) by mouth 2 (two) times daily as needed for anxiety. 10/24/20 02/19/21  Cook, Jayce G, DO  venlafaxine  XR (EFFEXOR  XR) 75 MG 24 hr capsule Take 1 capsule (75 mg total) by mouth daily with breakfast. 10/24/20 02/19/21  Cook, Jayce G, DO    Physical Exam   Triage Vital Signs: ED Triage Vitals  Encounter Vitals Group     BP 10/02/24 2053 92/61     Girls Systolic BP Percentile --      Girls Diastolic BP Percentile --      Boys Systolic BP Percentile --      Boys Diastolic BP Percentile --      Pulse Rate 10/02/24 2053 (!) 108     Resp 10/02/24 2053 20     Temp 10/02/24 2053 98.3 F (36.8 C)     Temp Source 10/02/24 2053 Oral     SpO2 10/02/24 2053 96 %     Weight --      Height --      Head Circumference --      Peak Flow --      Pain Score 10/02/24 2052 9     Pain Loc --      Pain Education --      Exclude from Growth Chart --     Most recent vital signs: Vitals:   10/03/24 0444 10/03/24 0509  BP:  (!) 95/52  Pulse:  97  Resp:  16  Temp: 99.5 F (37.5 C)   SpO2:  96%    CONSTITUTIONAL: Alert, responds appropriately to questions.  Appears uncomfortable, chronically ill-appearing HEAD: Normocephalic, atraumatic EYES: Conjunctivae clear, pupils appear equal, sclera nonicteric ENT: normal nose; moist mucous membranes NECK: Supple, normal ROM CARD: Regular and tachycardic; S1 and S2 appreciated RESP: Normal chest excursion without splinting or tachypnea; breath sounds clear and equal bilaterally; no wheezes, no rhonchi, no rales, no hypoxia or respiratory distress, speaking full sentences ABD/GI: Non-distended; soft, non-tender, no rebound, no guarding, no peritoneal signs BACK: The back appears normal, left CVA tenderness EXT: Normal ROM in all joints; no deformity noted, no edema SKIN: Normal color for age and race; warm; no rash on exposed skin NEURO: Moves all extremities equally, normal speech PSYCH: The  patient's mood and manner are appropriate.   ED Results / Procedures / Treatments   LABS: (all labs ordered are listed, but only abnormal results are displayed) Labs Reviewed  CBC - Abnormal; Notable for the following components:      Result Value   WBC 10.9 (*)    All other components within normal limits  COMPREHENSIVE METABOLIC PANEL WITH GFR - Abnormal; Notable for the following components:   Glucose, Bld 158 (*)    Creatinine, Ser 1.04 (*)    Calcium  8.6 (*)    Total Protein 6.2 (*)    AST 13 (*)    All other components within normal limits  URINALYSIS, ROUTINE W REFLEX MICROSCOPIC - Abnormal; Notable for the following components:   Color, Urine AMBER (*)    APPearance TURBID (*)    Hgb urine dipstick MODERATE (*)    Protein, ur 100 (*)    Nitrite POSITIVE (*)    Leukocytes,Ua LARGE (*)    Bacteria, UA RARE (*)    All other components within normal limits  LACTIC ACID, PLASMA - Abnormal; Notable for the following components:   Lactic Acid, Venous 2.3 (*)    All other components within normal limits  CBC - Abnormal; Notable for the following components:   RBC 3.36 (*)    Hemoglobin 10.1 (*)    HCT 30.4 (*)    Platelets 134 (*)    All other components within normal limits  CBG MONITORING, ED - Abnormal; Notable for the following components:   Glucose-Capillary 171 (*)    All other components within normal limits  RESP PANEL BY RT-PCR (RSV, FLU A&B, COVID)  RVPGX2  CULTURE, BLOOD (ROUTINE X 2)  CULTURE, BLOOD (ROUTINE X 2)  LACTIC ACID, PLASMA  PROCALCITONIN  HIV ANTIBODY (ROUTINE TESTING W REFLEX)  BASIC METABOLIC PANEL WITH GFR  TROPONIN T, HIGH SENSITIVITY  TROPONIN T, HIGH SENSITIVITY     EKG:  EKG Interpretation Date/Time:  Tuesday October 02 2024 21:05:52 EST Ventricular Rate:  92 PR Interval:  142 QRS Duration:  92 QT Interval:  364 QTC Calculation: 450 R Axis:   83  Text Interpretation: Normal sinus rhythm Nonspecific ST and T wave abnormality  Abnormal ECG When compared with ECG of 23-May-2020 19:53, Nonspecific T wave abnormality now evident in Lateral leads Confirmed by Neomi Neptune [phone]) on 10/03/2024 4:29:49 AM         RADIOLOGY: My personal review and interpretation of imaging: CT scans show no PE, pneumonia, pyelonephritis.  I have personally reviewed all radiology reports.   CT ABDOMEN PELVIS W CONTRAST Result Date: 10/03/2024 EXAM: CT ABDOMEN AND PELVIS WITH CONTRAST 10/03/2024 01:58:50 AM TECHNIQUE: CT of the abdomen and  pelvis was performed with the administration of 100 mL of iohexol  (OMNIPAQUE ) 350 MG/ML injection. Multiplanar reformatted images are provided for review. Automated exposure control, iterative reconstruction, and/or weight-based adjustment of the mA/kV was utilized to reduce the radiation dose to as low as reasonably achievable. COMPARISON: None available. CLINICAL HISTORY: Sepsis, infected urine, history of kidney stones, evaluate for pyelonephritis or infected stone. FINDINGS: LOWER CHEST: No acute abnormality. LIVER: The liver is unremarkable. GALLBLADDER AND BILE DUCTS: Gallbladder is unremarkable. No biliary ductal dilatation. SPLEEN: No acute abnormality. PANCREAS: No acute abnormality. ADRENAL GLANDS: No acute abnormality. KIDNEYS, URETERS AND BLADDER: No stones in the kidneys or ureters. No hydronephrosis. No perinephric or periureteral stranding. Urinary bladder is unremarkable. GI AND BOWEL: Stomach demonstrates no acute abnormality. Moderate stool burden throughout the colon. Normal appendix. There is no bowel obstruction. PERITONEUM AND RETROPERITONEUM: No ascites. No free air. VASCULATURE: Aorta is normal in caliber. LYMPH NODES: No lymphadenopathy. REPRODUCTIVE ORGANS: Left ovarian cyst measures 3.3 cm with layering high density material posteriorly, likely hematocrit level from hemorrhagic cyst. Prior hysterectomy. BONES AND SOFT TISSUES: No acute osseous abnormality. No focal soft tissue abnormality.  IMPRESSION: 1. No acute findings in the abdomen or pelvis. 2. 3.3 cm left ovarian hemorrhagic cyst. 3. Moderate stool burden throughout the colon. Electronically signed by: Franky Crease MD 10/03/2024 02:15 AM EST RP Workstation: [id number]   CT Angio Chest PE W and/or Wo Contrast Result Date: 10/03/2024 EXAM: CTA of the Chest with contrast for PE 10/03/2024 01:58:50 AM TECHNIQUE: CTA of the chest was performed after the administration of intravenous contrast. Multiplanar reformatted images are provided for review. MIP images are provided for review. Automated exposure control, iterative reconstruction, and/or weight based adjustment of the mA/kV was utilized to reduce the radiation dose to as low as reasonably achievable. COMPARISON: None available. CLINICAL HISTORY: Sepsis, chest pain and cough, rule out pneumonia and PE FINDINGS: PULMONARY ARTERIES: Pulmonary arteries are adequately opacified for evaluation. No pulmonary embolism. Main pulmonary artery is normal in caliber. MEDIASTINUM: The heart and pericardium demonstrate no acute abnormality. There is no acute abnormality of the thoracic aorta. LYMPH NODES: No mediastinal, hilar or axillary lymphadenopathy. LUNGS AND PLEURA: The lungs are without acute process. No focal consolidation or pulmonary edema. No pleural effusion or pneumothorax. UPPER ABDOMEN: Limited images of the upper abdomen are unremarkable. SOFT TISSUES AND BONES: No acute bone or soft tissue abnormality. IMPRESSION: 1. No pulmonary embolism or acute pulmonary abnormality. Electronically signed by: Franky Crease MD 10/03/2024 02:13 AM EST RP Workstation: [id number]   DG Chest 2 View Result Date: 10/02/2024 EXAM: 2 VIEW(S) XRAY OF THE CHEST 10/02/2024 09:25:00 PM COMPARISON: Chest x-ray dated 12/08/2022. CLINICAL HISTORY: CP SOB. FINDINGS: LUNGS AND PLEURA: No focal pulmonary opacity. No pleural effusion. No pneumothorax. HEART AND MEDIASTINUM: No acute abnormality of the cardiac and  mediastinal silhouettes. BONES AND SOFT TISSUES: No acute osseous abnormality. IMPRESSION: 1. No acute process. Electronically signed by: Greig Pique MD 10/02/2024 09:27 PM EST RP Workstation: [id number]     PROCEDURES:  Critical Care performed: Yes, see critical care procedure note(s)   CRITICAL CARE Performed by: Josette Sink   Total critical care time: 30 minutes  Critical care time was exclusive of separately billable procedures and treating other patients.  Critical care was necessary to treat or prevent imminent or life-threatening deterioration.  Critical care was time spent personally by me on the following activities: development of treatment plan with patient and/or surrogate as well as nursing, discussions with consultants, evaluation  of patient's response to treatment, examination of patient, obtaining history from patient or surrogate, ordering and performing treatments and interventions, ordering and review of laboratory studies, ordering and review of radiographic studies, pulse oximetry and re-evaluation of patient's condition.   SABRA1-3 Lead EKG Interpretation  Performed by: Rhianne Soman, Josette SAILOR, DO Authorized by: Jamesina Gaugh, Josette SAILOR, DO     Interpretation: normal     ECG rate:  97   ECG rate assessment: normal     Rhythm: sinus rhythm     Ectopy: none     Conduction: normal       IMPRESSION / MDM / ASSESSMENT AND PLAN / ED COURSE  I reviewed the triage vital signs and the nursing notes.    Patient here with cough, chest pain, subjective fevers and chills, flank pain, dysuria, nausea and vomiting.  She is hypotensive and tachycardic.  States her blood pressure is not normally this low.  Currently in the low 90s.  The patient is on the cardiac monitor to evaluate for evidence of arrhythmia and/or significant heart rate changes.   DIFFERENTIAL DIAGNOSIS (includes but not limited to):   Pyelonephritis, infected kidney stone, ascending UTI, pneumonia, viral URI,  bacteremia, sepsis, PE, ACS   Patient's presentation is most consistent with acute presentation with potential threat to life or bodily function.   PLAN: Will obtain labs, cultures, urine, CT of the chest, abdomen pelvis.  Will give IV fluids and broad-spectrum antibiotics.   MEDICATIONS GIVEN IN ED: Medications  vancomycin  (VANCOCIN ) IVPB 1000 mg/200 mL premix (1,000 mg Intravenous New Bag/Given 10/03/24 0443)  lactated ringers  infusion (150 mL/hr Intravenous New Bag/Given 10/03/24 0454)  enoxaparin  (LOVENOX ) injection 40 mg (has no administration in time range)  cefTRIAXone  (ROCEPHIN ) 1 g in sodium chloride  0.9 % 100 mL IVPB (has no administration in time range)  acetaminophen  (TYLENOL ) tablet 650 mg (has no administration in time range)    Or  acetaminophen  (TYLENOL ) suppository 650 mg (has no administration in time range)  HYDROcodone -acetaminophen  (NORCO/VICODIN) 5-325 MG per tablet 1-2 tablet (has no administration in time range)  ketorolac  (TORADOL ) 30 MG/ML injection 30 mg (has no administration in time range)  ondansetron  (ZOFRAN ) tablet 4 mg (has no administration in time range)    Or  ondansetron  (ZOFRAN ) injection 4 mg (has no administration in time range)  albuterol  (PROVENTIL ) (2.5 MG/3ML) 0.083% nebulizer solution 2.5 mg (has no administration in time range)  lactated ringers  bolus 1,000 mL (0 mLs Intravenous Stopped 10/03/24 0300)    And  lactated ringers  bolus 1,000 mL (0 mLs Intravenous Stopped 10/03/24 0341)  ceFEPIme  (MAXIPIME ) 2 g in sodium chloride  0.9 % 100 mL IVPB (0 g Intravenous Stopped 10/03/24 0143)  metroNIDAZOLE  (FLAGYL ) IVPB 500 mg (0 mg Intravenous Stopped 10/03/24 0432)  fentaNYL  (SUBLIMAZE ) injection 50 mcg (50 mcg Intravenous Given 10/03/24 0132)  ondansetron  (ZOFRAN ) injection 4 mg (4 mg Intravenous Given 10/03/24 0132)  acetaminophen  (TYLENOL ) tablet 1,000 mg (1,000 mg Oral Given 10/03/24 0141)  iohexol  (OMNIPAQUE ) 350 MG/ML injection 100 mL (100 mLs Intravenous  Contrast Given 10/03/24 0149)  ketorolac  (TORADOL ) 30 MG/ML injection 30 mg (30 mg Intravenous Given 10/03/24 0347)  fentaNYL  (SUBLIMAZE ) injection 50 mcg (50 mcg Intravenous Given 10/03/24 0349)     ED COURSE: Temperature here of 103.  Will give Tylenol .  Blood pressure improving with IV fluids.  Labs show no leukocytosis.  Lactic of 2.3 which is downtrending with fluids.  Troponin x 2 negative.  EKG shows no ischemic change.  Chest imaging  reviewed and interpreted by myself the radiologist and shows no infiltrate, edema or PE.  COVID, flu and RSV negative.  CT scan reviewed and interpreted by myself and the radiologist and shows no pyelonephritis, kidney stone.  She does have a left ovarian cyst but no pelvic pain on my exam.  She is getting treated clinically for suspicion of pyelonephritis.  Urine is infected.  Culture pending.  Procalcitonin is slightly elevated.  Will discuss with hospitalist for admission for sepsis secondary to ascending UTI/pyelonephritis.   CONSULTS:  Consulted and discussed patient's case with hospitalist, Dr. Cleatus.  I have recommended admission and consulting physician agrees and will place admission orders.  Patient (and family if present) agree with this plan.   I reviewed all nursing notes, vitals, pertinent previous records.  All labs, EKGs, imaging ordered have been independently reviewed and interpreted by myself.    OUTSIDE RECORDS REVIEWED: Reviewed most recent family medicine notes.       FINAL CLINICAL IMPRESSION(S) / ED DIAGNOSES   Final diagnoses:  Acute UTI  Acute sepsis (HCC)     Rx / DC Orders   ED Discharge Orders     None        Note:  This document was prepared using Dragon voice recognition software and may include unintentional dictation errors.   Roxy Filler, Josette SAILOR, DO 10/03/24 [phone]  "

## 2024-10-03 NOTE — Sepsis Progress Note (Signed)
 Elink monitoring for the code sepsis protocol.

## 2024-10-03 NOTE — ED Notes (Addendum)
 MD made aware pt's BP 89/58.

## 2024-10-03 NOTE — Progress Notes (Signed)
 CODE SEPSIS - PHARMACY COMMUNICATION  **Broad Spectrum Antibiotics should be administered within 1 hour of Sepsis diagnosis**  Time Code Sepsis Called/Page Received: 0106  Antibiotics Ordered: Cefepime , Flagyl , Vancomycin   Time of 1st antibiotic administration: 0134  Rankin CANDIE Dills, PharmD, MBA 10/03/2024 1:08 AM

## 2024-10-03 NOTE — ED Notes (Addendum)
 In room to assess BP of 91/44 (59) on monitor. Once in room, redirected pt to supine position and lowered head of bed. BP cuff readjusted and attempted on both arms. BP reading 106/54 (71) at this time. Initiated second LR bolus in attempt to improve BP. Pt educated on importance on assessments and monitoring of VS.

## 2024-10-03 NOTE — Plan of Care (Signed)

## 2024-10-03 NOTE — H&P (Signed)
 " History and Physical    [name] FMW:[id number] DOB: Jan 24, 1976 DOA: 10/02/2024  PCP: Patient, No Pcp Per (Confirm with patient/family/NH records and if not entered, this has to be entered at [facility] point of entry) Patient coming from: Home  I have personally briefly reviewed patient's old medical records in [facility]  Chief Complaint: Urinary frequency, whole body aching, fever and chills, nauseous vomiting  HPI: Courtney [name] is a 49 y.o. female with medical history significant of asthma/COPD, hypothyroidism, presented with multiple symptoms including increasing urinary frequency, bilateral flank pain, headache, fever chills, nauseous vomiting.  Symptoms started yesterday, suddenly that patient started develop headache, urinary frequency, muscle aching, bilateral flank pain and nauseous vomiting x 4 of stomach content.  In the evening, family found the patient was lethargic and confused and sent her to ED. ED Course: Tachycardia, fever 103.0 blood pressure 92/49, responded to 2 L of IV fluid bolus, blood work showed lactic acid 2.3> 1.1, WBC 10.9> 7.7, BUN 9 creatinine 0.9.  Calcium  7.7.  CTA negative for PE.  CT abdomen pelvis showed no significant findings.  Patient was given cefepime  and vancomycin  in the ED and bolus of 2000 mL NS.  Review of Systems: As per HPI otherwise 14 point review of systems negative.    Past Medical History:  Diagnosis Date   Chronic bronchitis (HCC)    Thyroid disease     Past Surgical History:  Procedure Laterality Date   ABDOMINAL HYSTERECTOMY     THYROIDECTOMY       reports that she has been smoking cigarettes. She has never used smokeless tobacco. She reports current alcohol use. She reports that she does not use drugs.  Allergies[1]  Family History  Problem Relation Age of Onset   Diabetes Mother    Pulmonary embolism Sister    Deep vein thrombosis Sister    Cancer Other    Breast cancer Neg Hx     Prior to Admission  medications  Medication Sig Start Date End Date Taking? Authorizing Provider  albuterol  (VENTOLIN  HFA) 108 (90 Base) MCG/ACT inhaler Inhale 2 puffs into the lungs every 4 (four) hours as needed. 12/08/22  Yes Bernardino Ditch, NP  levothyroxine  (SYNTHROID , LEVOTHROID) 112 MCG tablet Take 112 mcg by mouth daily before breakfast. 04/23/18  Yes [provider]  calcium  citrate-vitamin D  (CITRACAL+D) 315-200 MG-UNIT tablet Take 1 tablet by mouth 2 (two) times daily.  07/24/20  [provider]  clonazePAM  (KLONOPIN ) 0.5 MG tablet Take 1 tablet (0.5 mg total) by mouth 2 (two) times daily as needed for anxiety. 10/24/20 02/19/21  Cook, Jayce G, DO  venlafaxine  XR (EFFEXOR  XR) 75 MG 24 hr capsule Take 1 capsule (75 mg total) by mouth daily with breakfast. 10/24/20 02/19/21  Cook, Jayce G, DO    Physical Exam: Vitals:   10/03/24 0700 10/03/24 0730 10/03/24 0740 10/03/24 0823  BP: (!) 94/50 (!) 85/56 (!) 94/56   Pulse: 84 81 82   Resp: 19 17 17    Temp:    98.6 F (37 C)  TempSrc:    Oral  SpO2: 95% 97% 97%   Weight:      Height:        Constitutional: NAD, calm, comfortable Vitals:   10/03/24 0700 10/03/24 0730 10/03/24 0740 10/03/24 0823  BP: (!) 94/50 (!) 85/56 (!) 94/56   Pulse: 84 81 82   Resp: 19 17 17    Temp:    98.6 F (37 C)  TempSrc:  Oral  SpO2: 95% 97% 97%   Weight:      Height:       Eyes: PERRL, lids and conjunctivae normal ENMT: Mucous membranes are dry. Posterior pharynx clear of any exudate or lesions.Normal dentition.  Neck: normal, supple, no masses, no thyromegaly Respiratory: clear to auscultation bilaterally, no wheezing, no crackles. Normal respiratory effort. No accessory muscle use.  Cardiovascular: Regular rate and rhythm, no murmurs / rubs / gallops. No extremity edema. 2+ pedal pulses. No carotid bruits.  Abdomen: no tenderness, no masses palpated. No hepatosplenomegaly. Bowel sounds positive.  Musculoskeletal: no clubbing / cyanosis. No joint  deformity upper and lower extremities. Good ROM, no contractures. Normal muscle tone.  Skin: no rashes, lesions, ulcers. No induration Neurologic: CN 2-12 grossly intact. Sensation intact, DTR normal. Strength 5/5 in all 4.  Psychiatric: Normal judgment and insight. Alert and oriented x 3. Normal mood.     Labs on Admission: I have personally reviewed following labs and imaging studies  CBC: Recent Labs  Lab 10/02/24 2057 10/03/24 0501  WBC 10.9* 7.7  HGB 12.2 10.1*  HCT 37.0 30.4*  MCV 90.5 90.5  PLT 174 134*   Basic Metabolic Panel: Recent Labs  Lab 10/02/24 2057 10/03/24 0501  NA 135 134*  K 3.9 3.4*  CL 100 102  CO2 24 24  GLUCOSE 158* 129*  BUN 9 9  CREATININE 1.04* 0.92  CALCIUM  8.6* 7.7*   GFR: Estimated Creatinine Clearance: 77.1 mL/min (by C-G formula based on SCr of 0.92 mg/dL). Liver Function Tests: Recent Labs  Lab 10/02/24 2057 10/03/24 0501  AST 13* 12*  ALT 9 6  ALKPHOS 65 48  BILITOT 0.4 0.4  PROT 6.2* 5.0*  ALBUMIN 3.7 3.0*   No results for input(s): LIPASE, AMYLASE in the last 168 hours. No results for input(s): AMMONIA in the last 168 hours. Coagulation Profile: No results for input(s): INR, PROTIME in the last 168 hours. Cardiac Enzymes: No results for input(s): CKTOTAL, CKMB, CKMBINDEX, TROPONINI in the last 168 hours. BNP (last 3 results) No results for input(s): PROBNP in the last 8760 hours. HbA1C: No results for input(s): HGBA1C in the last 72 hours. CBG: Recent Labs  Lab 10/02/24 2055  GLUCAP 171*   Lipid Profile: No results for input(s): CHOL, HDL, LDLCALC, TRIG, CHOLHDL, LDLDIRECT in the last 72 hours. Thyroid Function Tests: No results for input(s): TSH, T4TOTAL, FREET4, T3FREE, THYROIDAB in the last 72 hours. Anemia Panel: No results for input(s): VITAMINB12, FOLATE, FERRITIN, TIBC, IRON, RETICCTPCT in the last 72 hours. Urine analysis:    Component Value  Date/Time   COLORURINE AMBER (A) 10/02/2024 2057   APPEARANCEUR TURBID (A) 10/02/2024 2057   LABSPEC 1.014 10/02/2024 2057   PHURINE 5.0 10/02/2024 2057   GLUCOSEU NEGATIVE 10/02/2024 2057   HGBUR MODERATE (A) 10/02/2024 2057   BILIRUBINUR NEGATIVE 10/02/2024 2057   KETONESUR NEGATIVE 10/02/2024 2057   PROTEINUR 100 (A) 10/02/2024 2057   NITRITE POSITIVE (A) 10/02/2024 2057   LEUKOCYTESUR LARGE (A) 10/02/2024 2057    Radiological Exams on Admission: CT ABDOMEN PELVIS W CONTRAST Result Date: 10/03/2024 EXAM: CT ABDOMEN AND PELVIS WITH CONTRAST 10/03/2024 01:58:50 AM TECHNIQUE: CT of the abdomen and pelvis was performed with the administration of 100 mL of iohexol  (OMNIPAQUE ) 350 MG/ML injection. Multiplanar reformatted images are provided for review. Automated exposure control, iterative reconstruction, and/or weight-based adjustment of the mA/kV was utilized to reduce the radiation dose to as low as reasonably achievable. COMPARISON: None available. CLINICAL HISTORY: Sepsis,  infected urine, history of kidney stones, evaluate for pyelonephritis or infected stone. FINDINGS: LOWER CHEST: No acute abnormality. LIVER: The liver is unremarkable. GALLBLADDER AND BILE DUCTS: Gallbladder is unremarkable. No biliary ductal dilatation. SPLEEN: No acute abnormality. PANCREAS: No acute abnormality. ADRENAL GLANDS: No acute abnormality. KIDNEYS, URETERS AND BLADDER: No stones in the kidneys or ureters. No hydronephrosis. No perinephric or periureteral stranding. Urinary bladder is unremarkable. GI AND BOWEL: Stomach demonstrates no acute abnormality. Moderate stool burden throughout the colon. Normal appendix. There is no bowel obstruction. PERITONEUM AND RETROPERITONEUM: No ascites. No free air. VASCULATURE: Aorta is normal in caliber. LYMPH NODES: No lymphadenopathy. REPRODUCTIVE ORGANS: Left ovarian cyst measures 3.3 cm with layering high density material posteriorly, likely hematocrit level from hemorrhagic  cyst. Prior hysterectomy. BONES AND SOFT TISSUES: No acute osseous abnormality. No focal soft tissue abnormality. IMPRESSION: 1. No acute findings in the abdomen or pelvis. 2. 3.3 cm left ovarian hemorrhagic cyst. 3. Moderate stool burden throughout the colon. Electronically signed by: Franky Crease MD 10/03/2024 02:15 AM EST RP Workstation: [id number]   CT Angio Chest PE W and/or Wo Contrast Result Date: 10/03/2024 EXAM: CTA of the Chest with contrast for PE 10/03/2024 01:58:50 AM TECHNIQUE: CTA of the chest was performed after the administration of intravenous contrast. Multiplanar reformatted images are provided for review. MIP images are provided for review. Automated exposure control, iterative reconstruction, and/or weight based adjustment of the mA/kV was utilized to reduce the radiation dose to as low as reasonably achievable. COMPARISON: None available. CLINICAL HISTORY: Sepsis, chest pain and cough, rule out pneumonia and PE FINDINGS: PULMONARY ARTERIES: Pulmonary arteries are adequately opacified for evaluation. No pulmonary embolism. Main pulmonary artery is normal in caliber. MEDIASTINUM: The heart and pericardium demonstrate no acute abnormality. There is no acute abnormality of the thoracic aorta. LYMPH NODES: No mediastinal, hilar or axillary lymphadenopathy. LUNGS AND PLEURA: The lungs are without acute process. No focal consolidation or pulmonary edema. No pleural effusion or pneumothorax. UPPER ABDOMEN: Limited images of the upper abdomen are unremarkable. SOFT TISSUES AND BONES: No acute bone or soft tissue abnormality. IMPRESSION: 1. No pulmonary embolism or acute pulmonary abnormality. Electronically signed by: Franky Crease MD 10/03/2024 02:13 AM EST RP Workstation: [id number]   DG Chest 2 View Result Date: 10/02/2024 EXAM: 2 VIEW(S) XRAY OF THE CHEST 10/02/2024 09:25:00 PM COMPARISON: Chest x-ray dated 12/08/2022. CLINICAL HISTORY: CP SOB. FINDINGS: LUNGS AND PLEURA: No focal pulmonary  opacity. No pleural effusion. No pneumothorax. HEART AND MEDIASTINUM: No acute abnormality of the cardiac and mediastinal silhouettes. BONES AND SOFT TISSUES: No acute osseous abnormality. IMPRESSION: 1. No acute process. Electronically signed by: Greig Pique MD 10/02/2024 09:27 PM EST RP Workstation: [id number]    EKG: Independently reviewed.  Sinus rhythm, no acute ST changes.  Assessment/Plan Principal Problem:   Sepsis secondary to UTI [facility])  (please populate well all problems here in Problem List. (For example, if patient is on BP meds at home and you resume or decide to hold them, it is a problem that needs to be her. Same for CAD, COPD, HLD and so on)  Sepsis, with temporary encephalopathy, resolved UTI with hematuria - Sepsis as evidenced by new onset fever, tachycardia, elevated lactic acid, source infection is a UTI with hematuria - Continue aggressive IVF resuscitation - Continue ceftriaxone , urine culture sent - Encephalopathy resolved.  Hypocalcemia - Calculated calcium  level 8.1 - 1 dose of IV calcium  - Check vitamin D  level  Hypothyroidism - Continue Synthroid   Asthma/COPD - Stable, continue  as needed breathing meds.  DVT prophylaxis: Lovenox  Code Status: Full code Family Communication: None at bedside Disposition Plan: Expect less than 2 midnight hospital stay Consults called: None Admission status: PCU observation   Cort ONEIDA Mana MD [facility] Pager [phone]  10/03/2024, 9:03 AM       [1] No Known Allergies  "

## 2024-10-03 NOTE — Consult Note (Signed)
 PHARMACY - PHYSICIAN COMMUNICATION CRITICAL VALUE ALERT - BLOOD CULTURE IDENTIFICATION (BCID)  Courtney [name] is an 49 y.o. female who presented to [facility] on 10/02/2024 with a chief complaint of sepsis 2/2 UTI  Assessment:  BCID= E.coli 1/4 bottles. No resistance detected  Name of physician (or Provider) Contacted: Dr. Laurita  Current antibiotics: Rocephin  2 GM  Changes to prescribed antibiotics recommended:  Patient is on recommended antibiotics - No changes needed  Results for orders placed or performed during the hospital encounter of 10/02/24  Blood Culture ID Panel (Reflexed) (Collected: 10/03/2024  1:27 AM)  Result Value Ref Range   Enterococcus faecalis NOT DETECTED NOT DETECTED   Enterococcus Faecium NOT DETECTED NOT DETECTED   Listeria monocytogenes NOT DETECTED NOT DETECTED   Staphylococcus species NOT DETECTED NOT DETECTED   Staphylococcus aureus (BCID) NOT DETECTED NOT DETECTED   Staphylococcus epidermidis NOT DETECTED NOT DETECTED   Staphylococcus lugdunensis NOT DETECTED NOT DETECTED   Streptococcus species NOT DETECTED NOT DETECTED   Streptococcus agalactiae NOT DETECTED NOT DETECTED   Streptococcus pneumoniae NOT DETECTED NOT DETECTED   Streptococcus pyogenes NOT DETECTED NOT DETECTED   A.calcoaceticus-baumannii NOT DETECTED NOT DETECTED   Bacteroides fragilis NOT DETECTED NOT DETECTED   Enterobacterales DETECTED (A) NOT DETECTED   Enterobacter cloacae complex NOT DETECTED NOT DETECTED   Escherichia coli DETECTED (A) NOT DETECTED   Klebsiella aerogenes NOT DETECTED NOT DETECTED   Klebsiella oxytoca NOT DETECTED NOT DETECTED   Klebsiella pneumoniae NOT DETECTED NOT DETECTED   Proteus species NOT DETECTED NOT DETECTED   Salmonella species NOT DETECTED NOT DETECTED   Serratia marcescens NOT DETECTED NOT DETECTED   Haemophilus influenzae NOT DETECTED NOT DETECTED   Neisseria meningitidis NOT DETECTED NOT DETECTED   Pseudomonas aeruginosa NOT DETECTED NOT DETECTED    Stenotrophomonas maltophilia NOT DETECTED NOT DETECTED   Candida albicans NOT DETECTED NOT DETECTED   Candida auris NOT DETECTED NOT DETECTED   Candida glabrata NOT DETECTED NOT DETECTED   Candida krusei NOT DETECTED NOT DETECTED   Candida parapsilosis NOT DETECTED NOT DETECTED   Candida tropicalis NOT DETECTED NOT DETECTED   Cryptococcus neoformans/gattii NOT DETECTED NOT DETECTED   CTX-M ESBL NOT DETECTED NOT DETECTED   Carbapenem resistance IMP NOT DETECTED NOT DETECTED   Carbapenem resistance KPC NOT DETECTED NOT DETECTED   Carbapenem resistance NDM NOT DETECTED NOT DETECTED   Carbapenem resist OXA 48 LIKE NOT DETECTED NOT DETECTED   Carbapenem resistance VIM NOT DETECTED NOT DETECTED    Courtney [name] 10/03/2024  12:52 PM

## 2024-10-04 ENCOUNTER — Other Ambulatory Visit: Payer: Self-pay

## 2024-10-04 MED ORDER — CIPROFLOXACIN HCL 500 MG PO TABS: 500.0000 mg | ORAL_TABLET | Freq: Two times a day (BID) | ORAL | 0 refills | Status: AC

## 2024-10-04 MED ORDER — CIPROFLOXACIN HCL 500 MG PO TABS
500.0000 mg | ORAL_TABLET | Freq: Two times a day (BID) | ORAL | Status: DC
  Administered 2024-10-04: 500 mg via ORAL
  Filled 2024-10-04: qty 1

## 2024-10-04 MED ORDER — CLONAZEPAM 0.5 MG PO TABS
0.5000 mg | ORAL_TABLET | Freq: Once | ORAL | Status: AC
  Administered 2024-10-04: 0.5 mg via ORAL
  Filled 2024-10-04: qty 1

## 2024-10-04 NOTE — Discharge Summary (Signed)
 Physician Discharge Summary  Courtney [name] FMW:[id number] DOB: 1975/10/01 DOA: 10/02/2024  PCP: Patient, No Pcp Per  Admit date: 10/02/2024 Discharge date: 10/04/2024  Admitted From: Home Disposition:  Home  Recommendations for Outpatient Follow-up:  Follow up with PCP in 1-2 weeks   Home Health: No Equipment/Devices: None  Discharge Condition: Stable CODE STATUS: Full Diet recommendation: Regular  Brief/Interim Summary:  49 y.o. female with medical history significant of asthma/COPD, hypothyroidism, presented with multiple symptoms including increasing urinary frequency, bilateral flank pain, headache, fever chills, nauseous vomiting.   Symptoms started yesterday, suddenly that patient started develop headache, urinary frequency, muscle aching, bilateral flank pain and nauseous vomiting x 4 of stomach content.  In the evening, family found the patient was lethargic and confused and sent her to ED.  ED Course: Tachycardia, fever 103.0 blood pressure 92/49, responded to 2 L of IV fluid bolus, blood work showed lactic acid 2.3> 1.1, WBC 10.9> 7.7, BUN 9 creatinine 0.9.  Calcium  7.7.  CTA negative for PE.  CT abdomen pelvis showed no significant findings.  1/8: Patient remained hemodynamically stable.  This morning on rounds received a message from both the bedside RN as well as the unit nurse manager the patient was extremely anxious and wished to leave.  I had a lengthy conversation with patient and her significant other at bedside.  I explained that she likely had a UTI with clinical pyelonephritis and resultant gram-negative bacteremia.  Patient is alert and oriented, no longer confused as was noted on prearrival.  She has remained afebrile since the Tmax of 103 on admission.  Patient was very anxious and insisted upon leaving.  Endorsed very poor sleep in the hospital.  After conversation and given overall hemodynamic stability I agreed to discharge patient on empiric oral antimicrobial  therapy for gram-negative bacteremia with the caveat that I will follow her culture and sensitivities and may need to contact the patient via phone post discharge to change antibiotics if resistance to ciprofloxacin  is noted on the urine culture.    Discharge Diagnoses:  Principal Problem:   Sepsis secondary to UTI [facility]) Active Problems:   UTI (urinary tract infection)   Sepsis (HCC) Patient hemodynamically stable at time of discharge.  See above for conversation and events noted on discharge today.  Discharged on ciprofloxacin  500 mg p.o. twice daily for additional week.   Discharge Instructions  Discharge Instructions     Increase activity slowly   Complete by: As directed       Allergies as of 10/04/2024   No Known Allergies      Medication List     TAKE these medications    albuterol  108 (90 Base) MCG/ACT inhaler Commonly known as: VENTOLIN  HFA Inhale 2 puffs into the lungs every 4 (four) hours as needed.   ciprofloxacin  500 MG tablet Commonly known as: CIPRO  Take 1 tablet (500 mg total) by mouth 2 (two) times daily for 7 days.   levothyroxine  112 MCG tablet Commonly known as: SYNTHROID  Take 112 mcg by mouth daily before breakfast.        Allergies[1]  Consultations: None   Procedures/Studies: CT ABDOMEN PELVIS W CONTRAST Result Date: 10/03/2024 EXAM: CT ABDOMEN AND PELVIS WITH CONTRAST 10/03/2024 01:58:50 AM TECHNIQUE: CT of the abdomen and pelvis was performed with the administration of 100 mL of iohexol  (OMNIPAQUE ) 350 MG/ML injection. Multiplanar reformatted images are provided for review. Automated exposure control, iterative reconstruction, and/or weight-based adjustment of the mA/kV was utilized to reduce the radiation dose  to as low as reasonably achievable. COMPARISON: None available. CLINICAL HISTORY: Sepsis, infected urine, history of kidney stones, evaluate for pyelonephritis or infected stone. FINDINGS: LOWER CHEST: No acute abnormality. LIVER:  The liver is unremarkable. GALLBLADDER AND BILE DUCTS: Gallbladder is unremarkable. No biliary ductal dilatation. SPLEEN: No acute abnormality. PANCREAS: No acute abnormality. ADRENAL GLANDS: No acute abnormality. KIDNEYS, URETERS AND BLADDER: No stones in the kidneys or ureters. No hydronephrosis. No perinephric or periureteral stranding. Urinary bladder is unremarkable. GI AND BOWEL: Stomach demonstrates no acute abnormality. Moderate stool burden throughout the colon. Normal appendix. There is no bowel obstruction. PERITONEUM AND RETROPERITONEUM: No ascites. No free air. VASCULATURE: Aorta is normal in caliber. LYMPH NODES: No lymphadenopathy. REPRODUCTIVE ORGANS: Left ovarian cyst measures 3.3 cm with layering high density material posteriorly, likely hematocrit level from hemorrhagic cyst. Prior hysterectomy. BONES AND SOFT TISSUES: No acute osseous abnormality. No focal soft tissue abnormality. IMPRESSION: 1. No acute findings in the abdomen or pelvis. 2. 3.3 cm left ovarian hemorrhagic cyst. 3. Moderate stool burden throughout the colon. Electronically signed by: Franky Crease MD 10/03/2024 02:15 AM EST RP Workstation: [id number]   CT Angio Chest PE W and/or Wo Contrast Result Date: 10/03/2024 EXAM: CTA of the Chest with contrast for PE 10/03/2024 01:58:50 AM TECHNIQUE: CTA of the chest was performed after the administration of intravenous contrast. Multiplanar reformatted images are provided for review. MIP images are provided for review. Automated exposure control, iterative reconstruction, and/or weight based adjustment of the mA/kV was utilized to reduce the radiation dose to as low as reasonably achievable. COMPARISON: None available. CLINICAL HISTORY: Sepsis, chest pain and cough, rule out pneumonia and PE FINDINGS: PULMONARY ARTERIES: Pulmonary arteries are adequately opacified for evaluation. No pulmonary embolism. Main pulmonary artery is normal in caliber. MEDIASTINUM: The heart and pericardium  demonstrate no acute abnormality. There is no acute abnormality of the thoracic aorta. LYMPH NODES: No mediastinal, hilar or axillary lymphadenopathy. LUNGS AND PLEURA: The lungs are without acute process. No focal consolidation or pulmonary edema. No pleural effusion or pneumothorax. UPPER ABDOMEN: Limited images of the upper abdomen are unremarkable. SOFT TISSUES AND BONES: No acute bone or soft tissue abnormality. IMPRESSION: 1. No pulmonary embolism or acute pulmonary abnormality. Electronically signed by: Franky Crease MD 10/03/2024 02:13 AM EST RP Workstation: [id number]   DG Chest 2 View Result Date: 10/02/2024 EXAM: 2 VIEW(S) XRAY OF THE CHEST 10/02/2024 09:25:00 PM COMPARISON: Chest x-ray dated 12/08/2022. CLINICAL HISTORY: CP SOB. FINDINGS: LUNGS AND PLEURA: No focal pulmonary opacity. No pleural effusion. No pneumothorax. HEART AND MEDIASTINUM: No acute abnormality of the cardiac and mediastinal silhouettes. BONES AND SOFT TISSUES: No acute osseous abnormality. IMPRESSION: 1. No acute process. Electronically signed by: Greig Pique MD 10/02/2024 09:27 PM EST RP Workstation: [id number]      Subjective: Seen and examined on the day of discharge.  Anxious but hemodynamically stable.  Appropriate for discharge home.  Discharge Exam: Vitals:   10/04/24 0610 10/04/24 0719  BP: 95/71 107/60  Pulse: 62 (!) 59  Resp: 16   Temp: 97.6 F (36.4 C) 98.7 F (37.1 C)  SpO2: 98% 97%   Vitals:   10/03/24 2110 10/04/24 0154 10/04/24 0610 10/04/24 0719  BP: 97/75 119/67 95/71 107/60  Pulse: 82 70 62 (!) 59  Resp:  16 16   Temp:  (!) 97.4 F (36.3 C) 97.6 F (36.4 C) 98.7 F (37.1 C)  TempSrc:  Oral Oral Oral  SpO2:  100% 98% 97%  Weight:  Height:        General: Pt is alert, awake, not in acute distress Cardiovascular: RRR, S1/S2 +, no rubs, no gallops Respiratory: CTA bilaterally, no wheezing, no rhonchi Abdominal: Soft, NT, ND, bowel sounds + Extremities: no edema, no  cyanosis    The results of significant diagnostics from this hospitalization (including imaging, microbiology, ancillary and laboratory) are listed below for reference.     Microbiology: Recent Results (from the past 240 hours)  Resp panel by RT-PCR (RSV, Flu A&B, Covid) Anterior Nasal Swab     Status: None   Collection Time: 10/03/24  1:27 AM   Specimen: Anterior Nasal Swab  Result Value Ref Range Status   SARS Coronavirus 2 by RT PCR NEGATIVE NEGATIVE Final    Comment: (NOTE) SARS-CoV-2 target nucleic acids are NOT DETECTED.  The SARS-CoV-2 RNA is generally detectable in upper respiratory specimens during the acute phase of infection. The lowest concentration of SARS-CoV-2 viral copies this assay can detect is 138 copies/mL. A negative result does not preclude SARS-Cov-2 infection and should not be used as the sole basis for treatment or other patient management decisions. A negative result may occur with  improper specimen collection/handling, submission of specimen other than nasopharyngeal swab, presence of viral mutation(s) within the areas targeted by this assay, and inadequate number of viral copies(<138 copies/mL). A negative result must be combined with clinical observations, patient history, and epidemiological information. The expected result is Negative.  Fact Sheet for Patients:  bloggercourse.com  Fact Sheet for Healthcare Providers:  seriousbroker.it  This test is no t yet approved or cleared by the [place]  FDA and  has been authorized for detection and/or diagnosis of SARS-CoV-2 by FDA under an Emergency Use Authorization (EUA). This EUA will remain  in effect (meaning this test can be used) for the duration of the COVID-19 declaration under Section 564(b)(1) of the Act, 21 U.S.C.section 360bbb-3(b)(1), unless the authorization is terminated  or revoked sooner.       Influenza A by PCR NEGATIVE  NEGATIVE Final   Influenza B by PCR NEGATIVE NEGATIVE Final    Comment: (NOTE) The Xpert Xpress SARS-CoV-2/FLU/RSV plus assay is intended as an aid in the diagnosis of influenza from Nasopharyngeal swab specimens and should not be used as a sole basis for treatment. Nasal washings and aspirates are unacceptable for Xpert Xpress SARS-CoV-2/FLU/RSV testing.  Fact Sheet for Patients: bloggercourse.com  Fact Sheet for Healthcare Providers: seriousbroker.it  This test is not yet approved or cleared by the [place]  FDA and has been authorized for detection and/or diagnosis of SARS-CoV-2 by FDA under an Emergency Use Authorization (EUA). This EUA will remain in effect (meaning this test can be used) for the duration of the COVID-19 declaration under Section 564(b)(1) of the Act, 21 U.S.C. section 360bbb-3(b)(1), unless the authorization is terminated or revoked.     Resp Syncytial Virus by PCR NEGATIVE NEGATIVE Final    Comment: (NOTE) Fact Sheet for Patients: bloggercourse.com  Fact Sheet for Healthcare Providers: seriousbroker.it  This test is not yet approved or cleared by the [place]  FDA and has been authorized for detection and/or diagnosis of SARS-CoV-2 by FDA under an Emergency Use Authorization (EUA). This EUA will remain in effect (meaning this test can be used) for the duration of the COVID-19 declaration under Section 564(b)(1) of the Act, 21 U.S.C. section 360bbb-3(b)(1), unless the authorization is terminated or revoked.  Performed at [facility], [address], [place], [place] [address]   Blood  Culture (routine x 2)     Status: Abnormal (Preliminary result)   Collection Time: 10/03/24  1:27 AM   Specimen: BLOOD  Result Value Ref Range Status   Specimen Description   Final    BLOOD BLOOD RIGHT ARM Performed at [facility], [address] [place], [place] [address]    Special Requests   Final    NONE Performed at [facility], [address] [place], [place] [address]    Culture  Setup Time   Final    Organism ID to follow GRAM NEGATIVE RODS IN BOTH AEROBIC AND ANAEROBIC BOTTLES CRITICAL RESULT CALLED TO, READ BACK BY AND VERIFIED WITH: TIFFANY GILCHRIST 10/03/24 1225 MU Performed at [facility], [address] [place], [place] [address]    Culture (A)  Final    ESCHERICHIA COLI SUSCEPTIBILITIES TO FOLLOW Performed at [facility], [address] [place], [place] [address]    Report Status PENDING  Incomplete  Blood Culture (routine x 2)     Status: None (Preliminary result)   Collection Time: 10/03/24  1:27 AM   Specimen: BLOOD  Result Value Ref Range Status   Specimen Description BLOOD RIGHT ANTECUBITAL  Final   Special Requests   Final    BOTTLES DRAWN AEROBIC AND ANAEROBIC Blood Culture adequate volume   Culture  Setup Time   Final    GRAM NEGATIVE RODS AEROBIC BOTTLE ONLY CRITICAL VALUE NOTED.  VALUE IS CONSISTENT WITH PREVIOUSLY REPORTED AND CALLED VALUE. Performed at [facility], [address] [place], [place] [address]    Culture GRAM NEGATIVE RODS  Final   Report Status PENDING  Incomplete  Blood Culture ID Panel (Reflexed)     Status: Abnormal   Collection Time: 10/03/24  1:27 AM  Result Value Ref Range Status   Enterococcus faecalis NOT DETECTED NOT DETECTED Final   Enterococcus Faecium NOT DETECTED NOT DETECTED Final   Listeria monocytogenes NOT DETECTED NOT DETECTED Final   Staphylococcus species NOT DETECTED NOT DETECTED Final   Staphylococcus aureus (BCID) NOT DETECTED NOT DETECTED Final   Staphylococcus epidermidis NOT DETECTED NOT DETECTED Final   Staphylococcus lugdunensis NOT DETECTED NOT DETECTED Final   Streptococcus species NOT DETECTED NOT DETECTED Final   Streptococcus agalactiae NOT DETECTED NOT DETECTED Final   Streptococcus  pneumoniae NOT DETECTED NOT DETECTED Final   Streptococcus pyogenes NOT DETECTED NOT DETECTED Final   A.calcoaceticus-baumannii NOT DETECTED NOT DETECTED Final   Bacteroides fragilis NOT DETECTED NOT DETECTED Final   Enterobacterales DETECTED (A) NOT DETECTED Final    Comment: Enterobacterales represent a large order of gram negative bacteria, not a single organism. CRITICAL RESULT CALLED TO, READ BACK BY AND VERIFIED WITH: TIFFANY GILCHRIST 10/03/24 1225 MU    Enterobacter cloacae complex NOT DETECTED NOT DETECTED Final   Escherichia coli DETECTED (A) NOT DETECTED Final    Comment: CRITICAL RESULT CALLED TO, READ BACK BY AND VERIFIED WITH: TIFFANY GILCHRIST 10/03/24 1225 MU    Klebsiella aerogenes NOT DETECTED NOT DETECTED Final   Klebsiella oxytoca NOT DETECTED NOT DETECTED Final   Klebsiella pneumoniae NOT DETECTED NOT DETECTED Final   Proteus species NOT DETECTED NOT DETECTED Final   Salmonella species NOT DETECTED NOT DETECTED Final   Serratia marcescens NOT DETECTED NOT DETECTED Final   Haemophilus influenzae NOT DETECTED NOT DETECTED Final   Neisseria meningitidis NOT DETECTED NOT DETECTED Final   Pseudomonas aeruginosa NOT DETECTED NOT DETECTED Final   Stenotrophomonas maltophilia NOT DETECTED  NOT DETECTED Final   Candida albicans NOT DETECTED NOT DETECTED Final   Candida auris NOT DETECTED NOT DETECTED Final   Candida glabrata NOT DETECTED NOT DETECTED Final   Candida krusei NOT DETECTED NOT DETECTED Final   Candida parapsilosis NOT DETECTED NOT DETECTED Final   Candida tropicalis NOT DETECTED NOT DETECTED Final   Cryptococcus neoformans/gattii NOT DETECTED NOT DETECTED Final   CTX-M ESBL NOT DETECTED NOT DETECTED Final   Carbapenem resistance IMP NOT DETECTED NOT DETECTED Final   Carbapenem resistance KPC NOT DETECTED NOT DETECTED Final   Carbapenem resistance NDM NOT DETECTED NOT DETECTED Final   Carbapenem resist OXA 48 LIKE NOT DETECTED NOT DETECTED Final   Carbapenem  resistance VIM NOT DETECTED NOT DETECTED Final    Comment: Performed at [facility], [address] [place], [place] [address]     Labs: BNP (last 3 results) No results for input(s): BNP in the last 8760 hours. Basic Metabolic Panel: Recent Labs  Lab 10/02/24 2057 10/03/24 0501  NA 135 134*  K 3.9 3.4*  CL 100 102  CO2 24 24  GLUCOSE 158* 129*  BUN 9 9  CREATININE 1.04* 0.92  CALCIUM  8.6* 7.7*   Liver Function Tests: Recent Labs  Lab 10/02/24 2057 10/03/24 0501  AST 13* 12*  ALT 9 6  ALKPHOS 65 48  BILITOT 0.4 0.4  PROT 6.2* 5.0*  ALBUMIN 3.7 3.0*   No results for input(s): LIPASE, AMYLASE in the last 168 hours. No results for input(s): AMMONIA in the last 168 hours. CBC: Recent Labs  Lab 10/02/24 2057 10/03/24 0501  WBC 10.9* 7.7  HGB 12.2 10.1*  HCT 37.0 30.4*  MCV 90.5 90.5  PLT 174 134*   Cardiac Enzymes: No results for input(s): CKTOTAL, CKMB, CKMBINDEX, TROPONINI in the last 168 hours. BNP: Invalid input(s): POCBNP CBG: Recent Labs  Lab 10/02/24 2055  GLUCAP 171*   D-Dimer No results for input(s): DDIMER in the last 72 hours. Hgb A1c No results for input(s): HGBA1C in the last 72 hours. Lipid Profile No results for input(s): CHOL, HDL, LDLCALC, TRIG, CHOLHDL, LDLDIRECT in the last 72 hours. Thyroid function studies No results for input(s): TSH, T4TOTAL, T3FREE, THYROIDAB in the last 72 hours.  Invalid input(s): FREET3 Anemia work up No results for input(s): VITAMINB12, FOLATE, FERRITIN, TIBC, IRON, RETICCTPCT in the last 72 hours. Urinalysis    Component Value Date/Time   COLORURINE AMBER (A) 10/02/2024 2057   APPEARANCEUR TURBID (A) 10/02/2024 2057   LABSPEC 1.014 10/02/2024 2057   PHURINE 5.0 10/02/2024 2057   GLUCOSEU NEGATIVE 10/02/2024 2057   HGBUR MODERATE (A) 10/02/2024 2057   BILIRUBINUR NEGATIVE 10/02/2024 2057   KETONESUR NEGATIVE 10/02/2024 2057    PROTEINUR 100 (A) 10/02/2024 2057   NITRITE POSITIVE (A) 10/02/2024 2057   LEUKOCYTESUR LARGE (A) 10/02/2024 2057   Sepsis Labs Recent Labs  Lab 10/02/24 2057 10/03/24 0501  WBC 10.9* 7.7   Microbiology Recent Results (from the past 240 hours)  Resp panel by RT-PCR (RSV, Flu A&B, Covid) Anterior Nasal Swab     Status: None   Collection Time: 10/03/24  1:27 AM   Specimen: Anterior Nasal Swab  Result Value Ref Range Status   SARS Coronavirus 2 by RT PCR NEGATIVE NEGATIVE Final    Comment: (NOTE) SARS-CoV-2 target nucleic acids are NOT DETECTED.  The SARS-CoV-2 RNA is generally detectable in upper respiratory specimens during the acute phase of infection. The lowest concentration of SARS-CoV-2 viral copies this assay can  detect is 138 copies/mL. A negative result does not preclude SARS-Cov-2 infection and should not be used as the sole basis for treatment or other patient management decisions. A negative result may occur with  improper specimen collection/handling, submission of specimen other than nasopharyngeal swab, presence of viral mutation(s) within the areas targeted by this assay, and inadequate number of viral copies(<138 copies/mL). A negative result must be combined with clinical observations, patient history, and epidemiological information. The expected result is Negative.  Fact Sheet for Patients:  bloggercourse.com  Fact Sheet for Healthcare Providers:  seriousbroker.it  This test is no t yet approved or cleared by the [place]  FDA and  has been authorized for detection and/or diagnosis of SARS-CoV-2 by FDA under an Emergency Use Authorization (EUA). This EUA will remain  in effect (meaning this test can be used) for the duration of the COVID-19 declaration under Section 564(b)(1) of the Act, 21 U.S.C.section 360bbb-3(b)(1), unless the authorization is terminated  or revoked sooner.       Influenza A  by PCR NEGATIVE NEGATIVE Final   Influenza B by PCR NEGATIVE NEGATIVE Final    Comment: (NOTE) The Xpert Xpress SARS-CoV-2/FLU/RSV plus assay is intended as an aid in the diagnosis of influenza from Nasopharyngeal swab specimens and should not be used as a sole basis for treatment. Nasal washings and aspirates are unacceptable for Xpert Xpress SARS-CoV-2/FLU/RSV testing.  Fact Sheet for Patients: bloggercourse.com  Fact Sheet for Healthcare Providers: seriousbroker.it  This test is not yet approved or cleared by the [place]  FDA and has been authorized for detection and/or diagnosis of SARS-CoV-2 by FDA under an Emergency Use Authorization (EUA). This EUA will remain in effect (meaning this test can be used) for the duration of the COVID-19 declaration under Section 564(b)(1) of the Act, 21 U.S.C. section 360bbb-3(b)(1), unless the authorization is terminated or revoked.     Resp Syncytial Virus by PCR NEGATIVE NEGATIVE Final    Comment: (NOTE) Fact Sheet for Patients: bloggercourse.com  Fact Sheet for Healthcare Providers: seriousbroker.it  This test is not yet approved or cleared by the [place]  FDA and has been authorized for detection and/or diagnosis of SARS-CoV-2 by FDA under an Emergency Use Authorization (EUA). This EUA will remain in effect (meaning this test can be used) for the duration of the COVID-19 declaration under Section 564(b)(1) of the Act, 21 U.S.C. section 360bbb-3(b)(1), unless the authorization is terminated or revoked.  Performed at [facility], [address] [place], [place] [address]   Blood Culture (routine x 2)     Status: Abnormal (Preliminary result)   Collection Time: 10/03/24  1:27 AM   Specimen: BLOOD  Result Value Ref Range Status   Specimen Description   Final    BLOOD BLOOD RIGHT ARM Performed at [facility], [address] [place], [place] [address]    Special Requests   Final    NONE Performed at [facility], [address] [place], [place] [address]    Culture  Setup Time   Final    Organism ID to follow GRAM NEGATIVE RODS IN BOTH AEROBIC AND ANAEROBIC BOTTLES CRITICAL RESULT CALLED TO, READ BACK BY AND VERIFIED WITH: TIFFANY GILCHRIST 10/03/24 1225 MU Performed at [facility], [address], [place], [place] [address]    Culture (A)  Final    ESCHERICHIA COLI SUSCEPTIBILITIES TO FOLLOW Performed at [facility], [address] [place], [place] [address]    Report Status  PENDING  Incomplete  Blood Culture (routine x 2)     Status: None (Preliminary result)   Collection Time: 10/03/24  1:27 AM   Specimen: BLOOD  Result Value Ref Range Status   Specimen Description BLOOD RIGHT ANTECUBITAL  Final   Special Requests   Final    BOTTLES DRAWN AEROBIC AND ANAEROBIC Blood Culture adequate volume   Culture  Setup Time   Final    GRAM NEGATIVE RODS AEROBIC BOTTLE ONLY CRITICAL VALUE NOTED.  VALUE IS CONSISTENT WITH PREVIOUSLY REPORTED AND CALLED VALUE. Performed at [facility], [address] [place], [place] [address]    Culture GRAM NEGATIVE RODS  Final   Report Status PENDING  Incomplete  Blood Culture ID Panel (Reflexed)     Status: Abnormal   Collection Time: 10/03/24  1:27 AM  Result Value Ref Range Status   Enterococcus faecalis NOT DETECTED NOT DETECTED Final   Enterococcus Faecium NOT DETECTED NOT DETECTED Final   Listeria monocytogenes NOT DETECTED NOT DETECTED Final   Staphylococcus species NOT DETECTED NOT DETECTED Final   Staphylococcus aureus (BCID) NOT DETECTED NOT DETECTED Final   Staphylococcus epidermidis NOT DETECTED NOT DETECTED Final   Staphylococcus lugdunensis NOT DETECTED NOT DETECTED Final   Streptococcus species NOT DETECTED NOT DETECTED Final   Streptococcus agalactiae NOT DETECTED NOT DETECTED Final    Streptococcus pneumoniae NOT DETECTED NOT DETECTED Final   Streptococcus pyogenes NOT DETECTED NOT DETECTED Final   A.calcoaceticus-baumannii NOT DETECTED NOT DETECTED Final   Bacteroides fragilis NOT DETECTED NOT DETECTED Final   Enterobacterales DETECTED (A) NOT DETECTED Final    Comment: Enterobacterales represent a large order of gram negative bacteria, not a single organism. CRITICAL RESULT CALLED TO, READ BACK BY AND VERIFIED WITH: TIFFANY GILCHRIST 10/03/24 1225 MU    Enterobacter cloacae complex NOT DETECTED NOT DETECTED Final   Escherichia coli DETECTED (A) NOT DETECTED Final    Comment: CRITICAL RESULT CALLED TO, READ BACK BY AND VERIFIED WITH: TIFFANY GILCHRIST 10/03/24 1225 MU    Klebsiella aerogenes NOT DETECTED NOT DETECTED Final   Klebsiella oxytoca NOT DETECTED NOT DETECTED Final   Klebsiella pneumoniae NOT DETECTED NOT DETECTED Final   Proteus species NOT DETECTED NOT DETECTED Final   Salmonella species NOT DETECTED NOT DETECTED Final   Serratia marcescens NOT DETECTED NOT DETECTED Final   Haemophilus influenzae NOT DETECTED NOT DETECTED Final   Neisseria meningitidis NOT DETECTED NOT DETECTED Final   Pseudomonas aeruginosa NOT DETECTED NOT DETECTED Final   Stenotrophomonas maltophilia NOT DETECTED NOT DETECTED Final   Candida albicans NOT DETECTED NOT DETECTED Final   Candida auris NOT DETECTED NOT DETECTED Final   Candida glabrata NOT DETECTED NOT DETECTED Final   Candida krusei NOT DETECTED NOT DETECTED Final   Candida parapsilosis NOT DETECTED NOT DETECTED Final   Candida tropicalis NOT DETECTED NOT DETECTED Final   Cryptococcus neoformans/gattii NOT DETECTED NOT DETECTED Final   CTX-M ESBL NOT DETECTED NOT DETECTED Final   Carbapenem resistance IMP NOT DETECTED NOT DETECTED Final   Carbapenem resistance KPC NOT DETECTED NOT DETECTED Final   Carbapenem resistance NDM NOT DETECTED NOT DETECTED Final   Carbapenem resist OXA 48 LIKE NOT DETECTED NOT DETECTED Final    Carbapenem resistance VIM NOT DETECTED NOT DETECTED Final    Comment: Performed at [facility], [address], [place], [place] [address]     Time coordinating discharge: 40 minutes  SIGNED:   Calvin KATHEE Robson, MD  [facility] 10/04/2024, 12:06 PM  Pager   If 7PM-7AM, please contact night-coverage     [1] No Known Allergies

## 2024-10-04 NOTE — TOC CM/SW Note (Signed)
 Transition of Care [facility]) CM/SW Note    Transition of Care [facility]) - Inpatient Brief Assessment   Patient Details  Name: Courtney [name] MRN: [id number] Date of Birth: 01-09-1976  Transition of Care [facility]) CM/SW Contact:    Alfonso Rummer, LCSW Phone Number: 10/04/2024, 9:47 AM   Clinical Narrative: Completed TOC chart review. PCP listed and medicaid information added to AVS. No additional TOC needs identified at this time. Please contact should needs arise.    Transition of Care Asessment: Insurance and Status: Selfpay Patient has primary care physician: No (pcp listed added to avs) Home environment has been reviewed: single family home Prior level of function:: independent Prior/Current Home Services: No current home services Social Drivers of Health Review: SDOH reviewed needs interventions (pcp list added to avs.) Readmission risk has been reviewed: No Transition of care needs: no transition of care needs at this time

## 2024-10-04 NOTE — Plan of Care (Signed)

## 2024-10-04 NOTE — Discharge Instructions (Addendum)
 Some PCP options in [place] area- not a comprehensive list  [facility]- [phone] [facility]- [phone] [facility]- [phone] [facility]- [phone] [facility]- [phone] Nichole Molly- [phone]  or [facility] Physician Referral Line [phone]   Please consider applying to [place]  Medicaid with [facility] of Social Services [address] [place], [place] [address]. Phone [phone]  or [facility] Department of Social Services : [address], [place], [place] [address] [phone] State Courier #: [id number] Phone: [phone]

## 2024-10-05 LAB — CULTURE, BLOOD (ROUTINE X 2): Special Requests: ADEQUATE

## 2024-10-05 LAB — URINE CULTURE: Culture: NO GROWTH
